# Patient Record
Sex: Female | Born: 1935
Health system: Southern US, Community
[De-identification: ages and names within clinical notes are randomized; demographics above are authoritative.]

## PROBLEM LIST (undated history)

## (undated) DIAGNOSIS — C801 Malignant (primary) neoplasm, unspecified: Secondary | ICD-10-CM

## (undated) DIAGNOSIS — E785 Hyperlipidemia, unspecified: Secondary | ICD-10-CM

## (undated) DIAGNOSIS — J449 Chronic obstructive pulmonary disease, unspecified: Secondary | ICD-10-CM

## (undated) DIAGNOSIS — E039 Hypothyroidism, unspecified: Secondary | ICD-10-CM

## (undated) DIAGNOSIS — J3489 Other specified disorders of nose and nasal sinuses: Secondary | ICD-10-CM

## (undated) DIAGNOSIS — R0600 Dyspnea, unspecified: Secondary | ICD-10-CM

## (undated) DIAGNOSIS — Z923 Personal history of irradiation: Secondary | ICD-10-CM

## (undated) HISTORY — PX: EYE SURGERY: SHX253

## (undated) HISTORY — PX: ABDOMINAL HYSTERECTOMY: SHX81

## (undated) HISTORY — PX: OTHER SURGICAL HISTORY: SHX169

## (undated) HISTORY — DX: Hypothyroidism, unspecified: E03.9

## (undated) HISTORY — DX: Hyperlipidemia, unspecified: E78.5

## (undated) HISTORY — PX: COLONOSCOPY: SHX174

---

## 2003-09-11 ENCOUNTER — Emergency Department (HOSPITAL_COMMUNITY): Admission: EM | Admit: 2003-09-11 | Discharge: 2003-09-11 | Payer: Self-pay | Admitting: Family Medicine

## 2004-01-24 ENCOUNTER — Ambulatory Visit: Payer: Self-pay | Admitting: Internal Medicine

## 2004-02-25 ENCOUNTER — Ambulatory Visit: Payer: Self-pay | Admitting: Internal Medicine

## 2004-02-26 ENCOUNTER — Encounter: Payer: Self-pay | Admitting: Internal Medicine

## 2004-02-26 ENCOUNTER — Ambulatory Visit (HOSPITAL_COMMUNITY): Admission: RE | Admit: 2004-02-26 | Discharge: 2004-02-26 | Payer: Self-pay | Admitting: Internal Medicine

## 2004-03-16 ENCOUNTER — Ambulatory Visit: Payer: Self-pay | Admitting: Internal Medicine

## 2005-03-08 ENCOUNTER — Encounter: Admission: RE | Admit: 2005-03-08 | Discharge: 2005-03-08 | Payer: Self-pay | Admitting: Internal Medicine

## 2006-03-09 ENCOUNTER — Encounter: Admission: RE | Admit: 2006-03-09 | Discharge: 2006-03-09 | Payer: Self-pay | Admitting: Internal Medicine

## 2006-08-22 ENCOUNTER — Emergency Department (HOSPITAL_COMMUNITY): Admission: EM | Admit: 2006-08-22 | Discharge: 2006-08-22 | Payer: Self-pay | Admitting: Emergency Medicine

## 2006-09-06 ENCOUNTER — Encounter: Admission: RE | Admit: 2006-09-06 | Discharge: 2006-09-06 | Payer: Self-pay | Admitting: Orthopaedic Surgery

## 2007-03-14 ENCOUNTER — Encounter: Admission: RE | Admit: 2007-03-14 | Discharge: 2007-03-14 | Payer: Self-pay | Admitting: Internal Medicine

## 2008-03-14 ENCOUNTER — Encounter: Admission: RE | Admit: 2008-03-14 | Discharge: 2008-03-14 | Payer: Self-pay | Admitting: Internal Medicine

## 2009-03-17 ENCOUNTER — Encounter: Admission: RE | Admit: 2009-03-17 | Discharge: 2009-03-17 | Payer: Self-pay | Admitting: Internal Medicine

## 2009-04-03 ENCOUNTER — Encounter: Payer: Self-pay | Admitting: Internal Medicine

## 2009-04-03 ENCOUNTER — Encounter (INDEPENDENT_AMBULATORY_CARE_PROVIDER_SITE_OTHER): Payer: Self-pay | Admitting: *Deleted

## 2009-05-15 DIAGNOSIS — K219 Gastro-esophageal reflux disease without esophagitis: Secondary | ICD-10-CM

## 2009-05-15 DIAGNOSIS — Z8719 Personal history of other diseases of the digestive system: Secondary | ICD-10-CM

## 2009-05-15 DIAGNOSIS — K5900 Constipation, unspecified: Secondary | ICD-10-CM | POA: Insufficient documentation

## 2009-05-15 DIAGNOSIS — K449 Diaphragmatic hernia without obstruction or gangrene: Secondary | ICD-10-CM | POA: Insufficient documentation

## 2009-05-15 DIAGNOSIS — E785 Hyperlipidemia, unspecified: Secondary | ICD-10-CM

## 2009-05-20 ENCOUNTER — Ambulatory Visit: Payer: Self-pay | Admitting: Internal Medicine

## 2009-05-20 DIAGNOSIS — M129 Arthropathy, unspecified: Secondary | ICD-10-CM | POA: Insufficient documentation

## 2009-05-23 ENCOUNTER — Ambulatory Visit: Payer: Self-pay | Admitting: Internal Medicine

## 2009-05-27 ENCOUNTER — Encounter: Payer: Self-pay | Admitting: Internal Medicine

## 2010-02-12 ENCOUNTER — Other Ambulatory Visit: Payer: Self-pay | Admitting: Internal Medicine

## 2010-02-12 DIAGNOSIS — Z1239 Encounter for other screening for malignant neoplasm of breast: Secondary | ICD-10-CM

## 2010-02-19 NOTE — Letter (Signed)
Summary: Patient Notice-Endo Biopsy Results  [facility]  [address] [place], [place] [address]   Phone: [phone]  Fax: [phone]        May 27, 2009 MRN: [id number]    [facility] [address] [place], [place]  [address]    Dear Ms. Orvan Falconer,  I am pleased to inform you that the biopsies taken during your recent endoscopic examination did not show any evidence of cancer upon pathologic examination.Biopsies show mild inflammation due to reflux.  Additional information/recommendations:  __No further action is needed at this time.  Please follow-up with      your primary care physician for your other healthcare needs.  __ Please call [phone] to schedule a return visit to review      your condition.  _x_ Continue with the treatment plan as outlined on the day of your      exam.     Please call us if you are having persistent problems or have questions about your condition that have not been fully answered at this time.  Sincerely,  Hart Carwin MD  This letter has been electronically signed by your physician.  Appended Document: Patient Notice-Endo Biopsy Results letter mailed 5.11.11

## 2010-02-19 NOTE — Procedures (Signed)
Summary: COLON   Colonoscopy  Procedure date:  02/25/2004  Findings:      Location:  [facility].   Patient Name: Jillian Scott, Jillian Scott MRN:  Procedure Procedures: Colonoscopy CPT: 78295.  Incomplete Colonoscopy, unable to go beyond splenic flexure. CPT: M2924229.  Personnel: Endoscopist: Dawsyn Zurn L. Juanda Chance, MD.  Referred By: Creola Corn, MD.  Exam Location: Exam performed in Outpatient Clinic. Outpatient  Patient Consent: Procedure, Alternatives, Risks and Benefits discussed, consent obtained, from patient. Consent was obtained by the RN.  Indications Symptoms: Constipation Patient's stools are infrequent.  History  Current Medications: Patient is not currently taking Coumadin.  Pre-Exam Physical: Performed Feb 25, 2004. Entire physical exam was normal.  Exam Exam: Extent of exam reached: Cecum, extent intended: Cecum.  The cecum was identified by appendiceal orifice and IC valve. Colon retroflexion performed. Images taken. ASA Classification: I. Tolerance: good.  Monitoring: Pulse and BP monitoring, Oximetry used. Supplemental O2 given.  Colon Prep Used Miralax for colon prep. Prep results: good.  Sedation Meds: Patient assessed and found to be appropriate for moderate (conscious) sedation. Fentanyl 25 mcg. given IV. Versed 3 mg. given IV.  Findings - NORMAL EXAM: Transverse Colon. Comments: difficult exam, unable to advance beyond splenic flexure.   Assessment  Comments: suspect  adhesions  Events  Unplanned Interventions: No intervention was required.  Unplanned Events: There were no complications. Plans Comments: Barium enema to vis the rest of the colon Disposition: After procedure patient sent to recovery. After recovery patient sent home.   This report was created from the original endoscopy report, which was reviewed and signed by the above listed endoscopist.

## 2010-02-19 NOTE — Assessment & Plan Note (Signed)
Summary: reflux--ch.   History of Present Illness Visit Type: consult  Primary GI MD: Lina Sar MD Primary Provider: Antony Madura, MD Requesting Provider: Antony Madura, MD Chief Complaint: GERD, belching, constipation, and hemorrhoids History of Present Illness:   This is a 75 year old African American female with complaints of gastroesophageal reflux not controlled on Nexium 40 mg every morning. She denies dysphagia or odynophagia. Her upper endoscopy in February 2006 showed a 2 cm  reducible hiatal hernia . The pathology showed mild inflammation consistent with esophagitis. Patient had a colonoscopy in February 2006 which was incomplete due to adhesions. A subsequent barium enema was also limited due to extreme redundancy of the sigmoid colon. She has diabetes and hyperlipidemia. Patient does not smoke. She has taken Advil until recently when she developed a rash.   GI Review of Systems    Reports acid reflux, belching, and  heartburn.      Denies abdominal pain, bloating, chest pain, dysphagia with liquids, dysphagia with solids, loss of appetite, nausea, vomiting, vomiting blood, weight loss, and  weight gain.      Reports constipation and  hemorrhoids.     Denies anal fissure, black tarry stools, change in bowel habit, diarrhea, diverticulosis, fecal incontinence, heme positive stool, irritable bowel syndrome, jaundice, light color stool, liver problems, rectal bleeding, and  rectal pain.    Current Medications (verified): 1)  Cod Liver Oil  Caps (Cod Liver Oil) .... One Tablet By Mouth Three Times A Day 2)  Vitamin B-12 500 Mcg Tabs (Cyanocobalamin) .... One Tablet By Mouth Once Daily 3)  Vitamin D3 1000 Unit Caps (Cholecalciferol) .... One Capsule By Mouth Once Daily 4)  Garlic Oil 1000 Mg Caps (Garlic) .... One Capsule By Mouth Three Times A Day 5)  Calcium-Magnesium-Zinc 750-375-15 Mg Tabs (Calcium-Magnesium-Zinc) .... One Tablet By Mouth Three Times A Day 6)  Nexium  40 Mg Cpdr (Esomeprazole Magnesium) .... One Tablet By Mouth Once Daily  Allergies (verified): No Known Drug Allergies  Past History:  Past Medical History: Current Problems:  ARTHRITIS (ICD-716.90) CONSTIPATION (ICD-564.00) HYPERLIPIDEMIA (ICD-272.4) GERD (ICD-530.81) REFLUX ESOPHAGITIS, HX OF (ICD-V12.79) HIATAL HERNIA (ICD-553.3)      Past Surgical History: Reviewed history from 05/15/2009 and no changes required. Partial Hysterectomy  Family History: Reviewed history from 05/15/2009 and no changes required. Family History of Heart Disease: Father Family History of Diabetes: Mother No FH of Colon Cancer:  Social History: Retired Single One child Alcohol Use - no Illicit Drug Use - no Patient has never smoked.  Smoking Status:  never  Review of Systems       The patient complains of allergy/sinus, arthritis/joint pain, and headaches-new.  The patient denies anemia, anxiety-new, back pain, blood in urine, breast changes/lumps, change in vision, confusion, cough, coughing up blood, depression-new, fainting, fatigue, fever, hearing problems, heart murmur, heart rhythm changes, itching, menstrual pain, muscle pains/cramps, night sweats, nosebleeds, pregnancy symptoms, shortness of breath, skin rash, sleeping problems, sore throat, swelling of feet/legs, swollen lymph glands, thirst - excessive , urination - excessive , urination changes/pain, urine leakage, vision changes, and voice change.         Pertinent positive and negative review of systems were noted in the above HPI. All other ROS was otherwise negative.   Vital Signs:  Patient profile:   75 year old female Height:      63 inches Weight:      140 pounds BMI:     24.89 BSA:     1.66 Pulse  rate:   60 / minute Pulse rhythm:   regular BP sitting:   128 / 64  (left arm) Cuff size:   regular  Vitals Entered By: Ok Anis CMA (May 20, 2009 9:37 AM)  Physical Exam  General:  Well developed, well nourished,  no acute distress. Mouth:  No deformity or lesions, dentition normal. Neck:  Supple; no masses or thyromegaly. Lungs:  Clear throughout to auscultation. Heart:  Regular rate and rhythm; no murmurs, rubs,  or bruits. Abdomen:  Soft, nontender and nondistended. No masses, hepatosplenomegaly or hernias noted. Normal bowel sounds. Extremities:  No clubbing, cyanosis, edema or deformities noted. Skin:  Intact without significant lesions or rashes. Psych:  Alert and cooperative. Normal mood and affect.   Impression & Recommendations:  Problem # 1:  GERD (ICD-530.81) Patient has gastroesophageal reflux refractory to Nexium 40 mg . There is no suggestion for aspiration or LPR. I have asked her to take Pepcid 40 mg at bedtime in addition to the Nexium 40 mg in the morning. We will schedule her for an upper endoscopy to rule out Barrett's esophagus and to assess the size of her hiatal hernia which measured 2 cm 5 years ago. She was advised to avoid Advil and  follow antireflux measures as well.  Orders: EGD (EGD)  Problem # 2:  CONSTIPATION (ICD-564.00) Patient had a redundant, tortuous colon demonstrated on her barium enema and on an incomplete colonoscopy in February 2006.  Patient Instructions: 1)  Follow antireflux measures. 2)  Nexium 40 mg p.o. q.a.m. 3)  Pepcid 40 mg p.o. q.h.s. 4)  Upper endoscopy with biopsies. 5)  Copy sent to : Dr Zenaida Deed 6)  The medication list was reviewed and reconciled.  All changed / newly prescribed medications were explained.  A complete medication list was provided to the patient / caregiver. Prescriptions: PEPCID 40 MG TABS (FAMOTIDINE) Take 1 tablet by mouth at bedtime  #30 x 3   Entered by:   Lamona Curl CMA (AAMA)   Authorized by:   Hart Carwin MD   Signed by:   Lamona Curl CMA (AAMA) on 05/20/2009   Method used:   Electronically to        [facility]  [phone]* (retail)       [address]       [place], [place]  [address]       Ph: [phone] or [phone]       Fax: [phone]   RxID:   [id number]

## 2010-02-19 NOTE — Letter (Signed)
Summary: New Patient letter  [facility]  [address] [place], [place] [address]   Phone: [phone]  Fax: [phone]       04/03/2009 MRN: [id number]  [facility] [place] [address] [address] [place], [place]  [address]  Dear Ms. Orvan Falconer,  Welcome to the Gastroenterology Division at [facility].    You are scheduled to see Dr.  Juanda Chance on 05-20-09 at 9:15a.m. on the 3rd floor at [facility], [address]  We ask that you try to arrive at our office 15 minutes prior to your appointment time to allow for check-in.  We would like you to complete the enclosed self-administered evaluation form prior to your visit and bring it with you on the day of your appointment.  We will review it with you.  Also, please bring a complete list of all your medications or, if you prefer, bring the medication bottles and we will list them.  Please bring your insurance card so that we may make a copy of it.  If your insurance requires a referral to see a specialist, please bring your referral form from your primary care physician.  Co-payments are due at the time of your visit and may be paid by cash, check or credit card.     Your office visit will consist of a consult with your physician (includes a physical exam), any laboratory testing he/she may order, scheduling of any necessary diagnostic testing (e.g. x-ray, ultrasound, CT-scan), and scheduling of a procedure (e.g. Endoscopy, Colonoscopy) if required.  Please allow enough time on your schedule to allow for any/all of these possibilities.    If you cannot keep your appointment, please call [phone] to cancel or reschedule prior to your appointment date.  This allows [place] the opportunity to schedule an appointment for another patient in need of care.  If you do not cancel or reschedule by 5 p.m. the business day prior to your appointment date, you will be charged a $50.00 late cancellation/no-show fee.    Thank you for choosing  [facility] for your medical needs.  We appreciate the opportunity to care for you.  Please visit [place] at our website  to learn more about our practice.                     Sincerely,                                                             The Gastroenterology Division

## 2010-02-19 NOTE — Procedures (Signed)
Summary: Upper Endoscopy  Patient: Jillian Scott Note: All result statuses are Final unless otherwise noted.  Tests: (1) Upper Endoscopy (EGD)   EGD Upper Endoscopy       DONE     [facility]     [address]     [place], [place]  [address]           ENDOSCOPY PROCEDURE REPORT           PATIENT:  [name], [name]  MR#:  [id number]     BIRTHDATE:  1935/04/20, 73 yrs. old  GENDER:  female           ENDOSCOPIST:  Hedwig Morton. Juanda Chance, MD     Referred by:  Burton Apley, M.D.           PROCEDURE DATE:  05/23/2009     PROCEDURE:  EGD with biopsy     ASA CLASS:  Class II     INDICATIONS:  GERD, heartburn GERD not controlled,     on Nexiem 40 mg po qd     EGD in 2006 showed 2 cm hiatal hernia           MEDICATIONS:   Versed 6 mg, Fentanyl 50 mcg     TOPICAL ANESTHETIC:  Exactacain Spray           DESCRIPTION OF PROCEDURE:   After the risks benefits and     alternatives of the procedure were thoroughly explained, informed     consent was obtained.  The LB GIF-H180 [id number] endoscope was     introduced through the mouth and advanced to the second portion of     the duodenum, without limitations.  The instrument was slowly     withdrawn as the mucosa was fully examined.     <<PROCEDUREIMAGES>>           A hiatal hernia was found. 2 cm hhernia     r/o Barrett's esophagus, increased fibrous tissue at g-e junction     With standard forceps, a biopsy was obtained and sent to pathology     (see image1, image2, and image3).  Otherwise the examination was     normal (see image5 and image4).    Retroflexed views revealed no     abnormalities.    The scope was then withdrawn from the patient     and the procedure completed.           COMPLICATIONS:  None           ENDOSCOPIC IMPRESSION:     1) Hiatal hernia     2) Otherwise normal examination     3) Antireflux measures     nexien 40 mg po qd     Pepcid 40 mg po qhs     consider adding Reglan     RECOMMENDATIONS:     1)  Anti-reflux regimen to be follow     2) Await biopsy results           REPEAT EXAM:  In 0 year(s) for.           ______________________________     Hedwig Morton. Juanda Chance, MD           CC:           n.     eSIGNED:   Hedwig Morton. Boy Delamater at 05/23/2009 03:23 PM           Carma Lair, [id number]  Note: An exclamation mark (!) indicates a  result that was not dispersed into the flowsheet. Document Creation Date: 05/23/2009 3:24 PM _______________________________________________________________________  (1) Order result status: Final Collection or observation date-time: 05/23/2009 15:15 Requested date-time:  Receipt date-time:  Reported date-time:  Referring Physician:   Ordering Physician: Lina Sar [phone]) Specimen Source:  Source: Launa Grill Order Number: [id number] Lab site:

## 2010-02-19 NOTE — Assessment & Plan Note (Signed)
Summary: Gastroenterology   Page #2  NAME:  Jillian Scott, Jillian Scott  OFFICE NO:  [id number]  DATE:  03/16/04  DOB:  05-31-2035  HISTORY:  The patient returns today to discuss results of the colonoscopy.  She had a screening colonoscopy on 02/25/04, which was a rather difficult exam.  I was unable to negotiate through the transverse colon due to redundancy.  She ended up having to have a barium enema at [facility], which also showed redundant sigmoid colon with only limited contrast migrating through the colon to transverse colon to the right colon.  She was unable to evacuate the barium and was quite uncomfortable during the exam.  There were no gross strictures or masses found in the right colon, but the visualization was limited.  I explained it today to the patient that she has a redundant long colon that probably explains her constipation, but no gross lesions were found.  Since she remains asymptomatic, I would not pursue it at this time, but would consider doing virtual colonoscopy in the next 4 to 5 years when she is due for recall.  We have also discussed the use of laxatives.  She is currently on milk of magnesia 30 cc 3 times a week.  That seems to be a quite satisfactory regimen for her and I could not recommend anything better than that.  As far as upper endoscopy is concerned, she had a small hiatal hernia with mild reflux esophagitis for which she takes Nexium 40 mg a day.  PLAN: 1.   Recall colonoscopy in 5 years.  At that point, we will consider virtual colonoscopy. 2.   Nexium 40 mg, take daily or p.r.n. epigastric discomfort. 3.   Milk of magnesia 30 to 45 cc three times a week.    Hedwig Morton. Juanda Chance, M.D.  DMB/rdj/med cc:  Gwen Pounds, M.D. D:  03/16/04; T:  03/17/04; Job [phone]

## 2010-02-19 NOTE — Letter (Signed)
Summary: EGD Instructions  [facility]  [address] [place], [place] [address]   Phone: [phone]  Fax: [phone]       Jillian Scott    Oct 02, 1935    MRN: [id number]       Procedure Day /Date: 05/23/09 Friday     Arrival Time:  2:00 pm     Procedure Time: 3:00 pm     Location of Procedure:                    _ x _ [facility] (4th Floor)  PREPARATION FOR ENDOSCOPY   On 05/23/09 THE DAY OF THE PROCEDURE:  1.   No solid foods, milk or milk products are allowed after midnight the night before your procedure.  2.   Do not drink anything colored red or purple.  Avoid juices with pulp.  No orange juice.  3.  You may drink clear liquids until 1:00 pm, which is 2 hours before your procedure.                                                                                                CLEAR LIQUIDS INCLUDE: Water Jello Ice Popsicles Tea (sugar ok, no milk/cream) Powdered fruit flavored drinks Coffee (sugar ok, no milk/cream) Gatorade Juice: apple, white grape, white cranberry  Lemonade Clear bullion, consomm, broth Carbonated beverages (any kind) Strained chicken noodle soup Hard Candy   MEDICATION INSTRUCTIONS  Unless otherwise instructed, you should take regular prescription medications with a small sip of water as early as possible the morning of your procedure.                  OTHER INSTRUCTIONS  You will need a responsible adult at least 73 years of age to accompany you and drive you home.   This person must remain in the waiting room during your procedure.  Wear loose fitting clothing that is easily removed.  Leave jewelry and other valuables at home.  However, you may wish to bring a book to read or an iPod/MP3 player to listen to music as you wait for your procedure to start.  Remove all body piercing jewelry and leave at home.  Total time from sign-in until discharge is approximately 2-3 hours.  You should go  home directly after your procedure and rest.  You can resume normal activities the day after your procedure.  The day of your procedure you should not:   Drive   Make legal decisions   Operate machinery   Drink alcohol   Return to work  You will receive specific instructions about eating, activities and medications before you leave.    The above instructions have been reviewed and explained to me by  Lamona Curl CMA Duncan Dull)  May 20, 2009 10:20 AM     I fully understand and can verbalize these instructions _____________________________ Date 05/19/09

## 2010-02-19 NOTE — Procedures (Signed)
Summary: EGD   EGD  Procedure date:  02/25/2004  Findings:      Location: [facility]   Patient Name: Jillian Scott, Jillian Scott MRN:  Procedure Procedures: Panendoscopy (EGD) CPT: 43235.    with biopsy(s)/brushing(s). CPT: D1846139.  Personnel: Endoscopist: Dora L. Juanda Chance, MD.  Referred By: Creola Corn, MD.  Exam Location: Exam performed in Outpatient Clinic. Outpatient  Patient Consent: Procedure, Alternatives, Risks and Benefits discussed, consent obtained, from patient. Consent was obtained by the RN.  Indications Symptoms: Abdominal pain, location: epigastric. Reflux symptoms for 6-10 yrs,  History  Current Medications: Patient is not currently taking Coumadin.  Pre-Exam Physical: Performed Feb 25, 2004  Entire physical exam was normal.  Exam Exam Info: Maximum depth of insertion Duodenum, intended Duodenum. Vocal cords visualized. Gastric retroflexion performed. Images taken. ASA Classification: I. Tolerance: good.  Sedation Meds: Patient assessed and found to be appropriate for moderate (conscious) sedation. Fentanyl 50 mcg. given IV. Versed 5 mg. given IV. Cetacaine Spray 2 sprays given aerosolized.  Monitoring: BP and pulse monitoring done. Oximetry used. Supplemental O2 given  Findings - HIATAL HERNIA: Diaphragm 37 cm from mouth. Z-line/GE Junction 35 cm from mouth. 2 cms. in length. reducible, sliding hernia. Biopsy/Hiatal Hernia taken.  ICD9: Hernia, Hiatal: 553.3.Comments: mild Grade i esophagitis at the g-e junction.  DIAGNOSTIC TEST: from Body. RUT done, results pending   Assessment Abnormal examination, see findings above.  Diagnoses: 553.3: Hernia, Hiatal.   Comments: Hiatal hernia with mild reflux esophagitis Events  Unplanned Intervention: No unplanned interventions were required.  Unplanned Events: There were no complications. Plans Medication(s): Await pathology. Other: nexiem 40mg  QD, starting Feb 25, 2004   Disposition: After  procedure patient sent to recovery. After recovery patient sent home.   This report was created from the original endoscopy report, which was reviewed and signed by the above listed endoscopist.    RUT NEGATIVE

## 2010-03-18 ENCOUNTER — Ambulatory Visit
Admission: RE | Admit: 2010-03-18 | Discharge: 2010-03-18 | Disposition: A | Discharge status: Home / Self Care | Payer: Medicare Other | Source: Ambulatory Visit | Attending: Internal Medicine | Admitting: Internal Medicine

## 2010-03-18 DIAGNOSIS — Z1239 Encounter for other screening for malignant neoplasm of breast: Secondary | ICD-10-CM

## 2011-02-12 ENCOUNTER — Other Ambulatory Visit: Payer: Self-pay | Admitting: Internal Medicine

## 2011-02-12 DIAGNOSIS — Z1231 Encounter for screening mammogram for malignant neoplasm of breast: Secondary | ICD-10-CM

## 2011-03-19 ENCOUNTER — Ambulatory Visit
Admission: RE | Admit: 2011-03-19 | Discharge: 2011-03-19 | Disposition: A | Discharge status: Home / Self Care | Payer: Medicare Other | Source: Ambulatory Visit | Attending: Internal Medicine | Admitting: Internal Medicine

## 2011-03-19 DIAGNOSIS — Z1231 Encounter for screening mammogram for malignant neoplasm of breast: Secondary | ICD-10-CM

## 2011-03-30 ENCOUNTER — Emergency Department (HOSPITAL_COMMUNITY)
Admission: EM | Admit: 2011-03-30 | Discharge: 2011-03-31 | Disposition: A | Discharge status: Home / Self Care | Payer: Medicare Other | Attending: Emergency Medicine | Admitting: Emergency Medicine

## 2011-03-30 ENCOUNTER — Encounter (HOSPITAL_COMMUNITY): Payer: Self-pay | Admitting: Emergency Medicine

## 2011-03-30 ENCOUNTER — Emergency Department (HOSPITAL_COMMUNITY): Payer: Medicare Other

## 2011-03-30 DIAGNOSIS — K5900 Constipation, unspecified: Secondary | ICD-10-CM | POA: Insufficient documentation

## 2011-03-30 DIAGNOSIS — K644 Residual hemorrhoidal skin tags: Secondary | ICD-10-CM

## 2011-03-30 LAB — CBC
MCHC: 33.6 g/dL (ref 30.0–36.0)
MCV: 86.2 fL (ref 78.0–100.0)
RBC: 4.84 MIL/uL (ref 3.87–5.11)
RDW: 14.7 % (ref 11.5–15.5)

## 2011-03-30 LAB — OCCULT BLOOD, POC DEVICE: Fecal Occult Bld: POSITIVE

## 2011-03-30 MED ORDER — PRAMOXINE-ZINC OXIDE IN MO 1-12.5 % RE OINT: TOPICAL_OINTMENT | RECTAL | Status: DC

## 2011-03-30 MED ORDER — MAGNESIUM CITRATE PO SOLN
1.0000 | ORAL | Status: AC
  Administered 2011-03-31: 1 via ORAL
  Filled 2011-03-30: qty 296

## 2011-03-30 MED ORDER — FLEET ENEMA 7-19 GM/118ML RE ENEM
1.0000 | ENEMA | Freq: Once | RECTAL | Status: AC
Start: 2011-03-30 — End: 2011-03-31
  Administered 2011-03-31: via RECTAL
  Filled 2011-03-30: qty 1

## 2011-03-30 MED ORDER — POLYETHYLENE GLYCOL 3350 17 GM/SCOOP PO POWD: 17.0000 g | Freq: Every day | ORAL | Status: AC

## 2011-03-30 NOTE — Discharge Instructions (Signed)

## 2011-03-30 NOTE — ED Notes (Signed)
Pt returned from radiology stable and in no acute distress.

## 2011-03-30 NOTE — ED Provider Notes (Addendum)
History     CSN: [id number]  Arrival date & time 03/30/11  2123   First MD Initiated Contact with Patient 03/30/11 2255      Chief Complaint  Patient presents with  . Constipation    (Consider location/radiation/quality/duration/timing/severity/associated sxs/prior treatment) HPI No bowel movement last 9 days. Patient has history of constipation. She tried Metamucil last night and she tried an enema today. She has not taken any other stool softeners. She states she drinks plenty of water. She does not relax. No new medications otherwise. No fevers or chills. Patient has had some uncomfortable hemorrhoids that she date have not been bleeding, but are uncomfortable. No discrete abdominal pain. No nausea or vomiting. Moderate in severity. No no alleviating factors. hAs required disimpaction in the past.   History reviewed. No pertinent past medical history.  Past Surgical History  Procedure Date  . Abdominal hysterectomy     No family history on file.  History  Substance Use Topics  . Smoking status: Never Smoker   . Smokeless tobacco: Not on file  . Alcohol Use: No    OB History    Grav Para Term Preterm Abortions TAB SAB Ect Mult Living                  Review of Systems  Constitutional: Negative for fever and chills.  HENT: Negative for neck pain and neck stiffness.   Eyes: Negative for pain.  Respiratory: Negative for shortness of breath.   Cardiovascular: Negative for chest pain.  Gastrointestinal: Positive for constipation. Negative for nausea, vomiting, abdominal pain, abdominal distention and rectal pain.  Genitourinary: Negative for dysuria.  Musculoskeletal: Negative for back pain.  Skin: Negative for rash.  Neurological: Negative for headaches.  All other systems reviewed and are negative.    Allergies  Review of patient's allergies indicates no known allergies.  Home Medications   Current Outpatient Rx  Name Route Sig Dispense Refill  .  CALCIUM-MAGNESIUM-ZINC PO Oral Take 1 tablet by mouth 2 (two) times daily.    Marland Kitchen VITAMIN B-12 PO Oral Take 1 tablet by mouth daily.    Marland Kitchen ESOMEPRAZOLE MAGNESIUM 40 MG PO CPDR Oral Take 40 mg by mouth daily before breakfast.    . ROSUVASTATIN CALCIUM 40 MG PO TABS Oral Take 40 mg by mouth daily.    Marland Kitchen VITAMIN D (CHOLECALCIFEROL) PO Oral Take 1 tablet by mouth daily.      BP 155/76  Pulse 116  Temp(Src) 98.6 F (37 C) (Oral)  Resp 20  SpO2 100%  Physical Exam  Constitutional: She is oriented to person, place, and time. She appears well-developed and well-nourished.  HENT:  Head: Normocephalic and atraumatic.  Eyes: Conjunctivae and EOM are normal. Pupils are equal, round, and reactive to light.  Neck: Trachea normal. Neck supple. No thyromegaly present.  Cardiovascular: Normal rate, regular rhythm, S1 normal, S2 normal and normal pulses.     No systolic murmur is present   No diastolic murmur is present  Pulses:      Radial pulses are 2+ on the right side, and 2+ on the left side.  Pulmonary/Chest: Effort normal and breath sounds normal. She has no wheezes. She has no rhonchi. She has no rales. She exhibits no tenderness.  Abdominal: Soft. Normal appearance and bowel sounds are normal. There is no tenderness. There is no CVA tenderness and negative Murphy's sign.  Genitourinary:       External hemorrhoids present, no thrombosed hemorrhoids. No blood or evidence  of active bleeding hemorrhoids. Stool in rectal vault is soft, brown without palpable impaction  Musculoskeletal:       BLE:s Calves nontender, no cords or erythema, negative Homans sign  Neurological: She is alert and oriented to person, place, and time. She has normal strength. No cranial nerve deficit or sensory deficit. GCS eye subscore is 4. GCS verbal subscore is 5. GCS motor subscore is 6.  Skin: Skin is warm and dry. No rash noted. She is not diaphoretic.  Psychiatric: Her speech is normal.       Cooperative and  appropriate    ED Course  Procedures (including critical care time)  Results for orders placed during the hospital encounter of 03/30/11  CBC      Component Value Range   WBC 16.4 (*) 4.0 - 10.5 (K/uL)   RBC 4.84  3.87 - 5.11 (MIL/uL)   Hemoglobin 14.0  12.0 - 15.0 (g/dL)   HCT 40.9  81.1 - 91.4 (%)   MCV 86.2  78.0 - 100.0 (fL)   MCH 28.9  26.0 - 34.0 (pg)   MCHC 33.6  30.0 - 36.0 (g/dL)   RDW 78.2  95.6 - 21.3 (%)   Platelets 244  150 - 400 (K/uL)  OCCULT BLOOD, POC DEVICE      Component Value Range   Fecal Occult Bld POSITIVE    POCT I-STAT, CHEM 8      Component Value Range   Sodium 136  135 - 145 (mEq/L)   Potassium 4.5  3.5 - 5.1 (mEq/L)   Chloride 101  96 - 112 (mEq/L)   BUN 10  6 - 23 (mg/dL)   Creatinine, Ser 0.86  0.50 - 1.10 (mg/dL)   Glucose, Bld 578 (*) 70 - 99 (mg/dL)   Calcium, Ion 4.69  6.29 - 1.32 (mmol/L)   TCO2 26  0 - 100 (mmol/L)   Hemoglobin 15.0  12.0 - 15.0 (g/dL)   HCT 52.8  41.3 - 24.4 (%)   Dg Abd 1 View  03/30/2011  *RADIOLOGY REPORT*  Clinical Data: Chronic constipation  ABDOMEN - 1 VIEW  Comparison: None.  Findings: Significant stool throughout the colon.  Gas-filled loops of small bowel are present without disproportionate dilatation.  No obvious free intraperitoneal gas.  Phlebolith projects over the pelvis.  IMPRESSION: Nonobstructive bowel gas pattern.  Prominent stool.  Original Report Authenticated By: Donavan Burnet, M.D.   Mm Digital Screening  03/22/2011  DG SCREEN MAMMOGRAM BILATERAL Bilateral CC and MLO view(s) were taken.  DIGITAL SCREENING MAMMOGRAM WITH CAD: The breast tissue is heterogeneously dense.  No masses or malignant type calcifications are  identified.  Compared with prior studies.  Images were processed with CAD.  IMPRESSION: No specific mammographic evidence of malignancy.  Next screening mammogram is recommended in one  year.  A result letter of this screening mammogram will be mailed directly to the patient.  ASSESSMENT:  Negative - BI-RADS 1  Screening mammogram in 1 year. ,    Dg Abd 1 View  03/30/2011  *RADIOLOGY REPORT*  Clinical Data: Chronic constipation  ABDOMEN - 1 VIEW  Comparison: None.  Findings: Significant stool throughout the colon.  Gas-filled loops of small bowel are present without disproportionate dilatation.  No obvious free intraperitoneal gas.  Phlebolith projects over the pelvis.  IMPRESSION: Nonobstructive bowel gas pattern.  Prominent stool.  Original Report Authenticated By: Donavan Burnet, M.D.   Patient initially tachycardic on initial evaluation very anxious. Recheck vital signs improved  MDM  Constipation with benign abdominal exam. Patient declines manual disimpaction requests medication trial. MiraLAX prescribed. Patient agrees to start stool softeners over-the-counter as well.  Constipation precautions verbalized is understood. Plan outpatient followup as needed. Anusol prescribed as well for hemorrhoids        Sunnie Nielsen, MD 03/30/11 3244  Sunnie Nielsen, MD 03/31/11 [id number]

## 2011-03-30 NOTE — ED Notes (Signed)
PT. REPORTS CONSTIPATION ONSET THIS EVENING , ALSO CLAIM HEMORRHOIDS WITH NO BLEEDING.

## 2011-03-31 LAB — POCT I-STAT, CHEM 8
Calcium, Ion: 1.13 mmol/L (ref 1.12–1.32)
HCT: 44 % (ref 36.0–46.0)
Hemoglobin: 15 g/dL (ref 12.0–15.0)
TCO2: 26 mmol/L (ref 0–100)

## 2012-02-22 ENCOUNTER — Other Ambulatory Visit: Payer: Self-pay | Admitting: Internal Medicine

## 2012-02-22 DIAGNOSIS — Z1231 Encounter for screening mammogram for malignant neoplasm of breast: Secondary | ICD-10-CM

## 2012-03-21 ENCOUNTER — Ambulatory Visit: Payer: Medicare Other

## 2012-03-28 ENCOUNTER — Ambulatory Visit
Admission: RE | Admit: 2012-03-28 | Discharge: 2012-03-28 | Disposition: A | Discharge status: Home / Self Care | Payer: Medicare Other | Source: Ambulatory Visit | Attending: Internal Medicine | Admitting: Internal Medicine

## 2012-03-28 DIAGNOSIS — Z1231 Encounter for screening mammogram for malignant neoplasm of breast: Secondary | ICD-10-CM

## 2012-05-24 ENCOUNTER — Emergency Department (HOSPITAL_COMMUNITY): Payer: Medicare Other

## 2012-05-24 ENCOUNTER — Emergency Department (HOSPITAL_COMMUNITY)
Admission: EM | Admit: 2012-05-24 | Discharge: 2012-05-24 | Disposition: A | Discharge status: Home / Self Care | Payer: Medicare Other | Attending: Emergency Medicine | Admitting: Emergency Medicine

## 2012-05-24 ENCOUNTER — Encounter (HOSPITAL_COMMUNITY): Payer: Self-pay | Admitting: *Deleted

## 2012-05-24 DIAGNOSIS — Z79899 Other long term (current) drug therapy: Secondary | ICD-10-CM | POA: Insufficient documentation

## 2012-05-24 DIAGNOSIS — H938X1 Other specified disorders of right ear: Secondary | ICD-10-CM

## 2012-05-24 DIAGNOSIS — J3489 Other specified disorders of nose and nasal sinuses: Secondary | ICD-10-CM | POA: Insufficient documentation

## 2012-05-24 DIAGNOSIS — H938X9 Other specified disorders of ear, unspecified ear: Secondary | ICD-10-CM | POA: Insufficient documentation

## 2012-05-24 DIAGNOSIS — H538 Other visual disturbances: Secondary | ICD-10-CM | POA: Insufficient documentation

## 2012-05-24 DIAGNOSIS — H547 Unspecified visual loss: Secondary | ICD-10-CM

## 2012-05-24 LAB — SEDIMENTATION RATE: Sed Rate: 10 mm/hr (ref 0–22)

## 2012-05-24 MED ORDER — TETRACAINE HCL 0.5 % OP SOLN
1.0000 [drp] | Freq: Once | OPHTHALMIC | Status: AC
  Administered 2012-05-24: 1 [drp] via OPHTHALMIC
  Filled 2012-05-24: qty 2

## 2012-05-24 NOTE — ED Notes (Signed)
Her ear feels like it is full of water

## 2012-05-24 NOTE — ED Provider Notes (Signed)
77 year old female with right. Her regular pain, sinus tenderness on the right and decreased vision in the right eye. There is no signs of temporal artery tenderness on my exam, no TMJ tenderness and no difficulty with opening or closing her mouth. Lungs and heart are clear, tympanic membranes visualized without any obstructing cerumen, erythema, bulging or opacification. Visual acuity a symmetric. Tono-Pen used for IOP, no significant elevation. Patient will need ophthalmology referral. CT scan show no signs of neurologic abnormalities, neurology consultation by physician assistant and agrees not temporal arteritis.   Medical screening examination/treatment/procedure(s) were conducted as a shared visit with non-physician practitioner(s) and myself.  I personally evaluated the patient during the encounter    Vida Roller, MD 05/24/12 2344

## 2012-05-24 NOTE — ED Provider Notes (Signed)
History    This chart was scribed for non-physician practitioner working with Vida Roller, MD by Smitty Pluck, ED scribe. This patient was seen in room TR04C/TR04C and the patient's care was started at 6:00 PM.   CSN: [id number]  Arrival date & time 05/24/12  1721       Chief Complaint  Patient presents with  . Otalgia     The history is provided by the patient and medical records. No language interpreter was used.   Rever Pichette is a 77 y.o. female with hx of DM who presents to the Emergency Department with chief complaint of constant, moderate right otalgia that has been ongoing for past 2 weeks. Pt reports that her right ear feels "plugged." She states that she feels pressure around her right eye and blurred vision in the right eye.She reports that she had sinus congestion and pressure 1 month ago.  Pt denies injury to right ear fever, chills, nausea, vomiting, diarrhea, weakness, cough, SOB and any other pain.    PCP is Dr. Su Hilt   History reviewed. No pertinent past medical history.  Past Surgical History  Procedure Laterality Date  . Abdominal hysterectomy      No family history on file.  History  Substance Use Topics  . Smoking status: Never Smoker   . Smokeless tobacco: Not on file  . Alcohol Use: No    OB History   Grav Para Term Preterm Abortions TAB SAB Ect Mult Living                  Review of Systems 10 Systems reviewed and all are negative for acute change except as noted in the HPI.   Allergies  Review of patient's allergies indicates no known allergies.  Home Medications   Current Outpatient Rx  Name  Route  Sig  Dispense  Refill  . CALCIUM-MAGNESIUM-ZINC PO   Oral   Take 1 tablet by mouth 2 (two) times daily.         . Cyanocobalamin (VITAMIN B-12 PO)   Oral   Take 1 tablet by mouth daily.         Marland Kitchen esomeprazole (NEXIUM) 40 MG capsule   Oral   Take 40 mg by mouth daily before breakfast.         . rosuvastatin (CRESTOR)  40 MG tablet   Oral   Take 40 mg by mouth daily.         Marland Kitchen VITAMIN D, CHOLECALCIFEROL, PO   Oral   Take 1 tablet by mouth daily.           BP 149/75  Pulse 77  Temp(Src) 98.8 F (37.1 C) (Oral)  Resp 16  SpO2 100%  Physical Exam  Nursing note and vitals reviewed. Constitutional: She is oriented to person, place, and time. She appears well-developed and well-nourished. No distress.  HENT:  Head: Normocephalic and atraumatic.  Equal hearing  AC>BC bilaterally  Tympanic membranes are clear and pearly gray bilaterally with anterior cone of light  Frontal and maxillary sinuses are nontender to palpation No tenderness over the temporal bone   Eyes: Conjunctivae and EOM are normal. Pupils are equal, round, and reactive to light.  visualized funduscopic exam is nl  Eye pressure on left is 12 and eye pressure on right is 19 Visual Acuity  -  Bilateral Near:  20/25 ; R Near:  20/50 ; L Near:  20/30  Neck: Neck supple. No tracheal deviation present.  Cardiovascular: Normal  rate, regular rhythm and normal heart sounds.  Exam reveals no gallop and no friction rub.   No murmur heard. Pulmonary/Chest: Effort normal and breath sounds normal. No respiratory distress. She has no wheezes. She has no rales. She exhibits no tenderness.  Abdominal: She exhibits no distension.  Musculoskeletal: Normal range of motion.  Neurological: She is alert and oriented to person, place, and time.  Skin: Skin is warm and dry.  Psychiatric: She has a normal mood and affect. Her behavior is normal.    ED Course  Procedures (including critical care time) DIAGNOSTIC STUDIES: Oxygen Saturation is 100% on room air, normal by my interpretation.    COORDINATION OF CARE: 6:03 PM Discussed ED treatment with pt and pt agrees.  6:58 PM Dr. Hyacinth Meeker evaluated pt and recommends CT head and CT maxillary.     Results for orders placed during the hospital encounter of 03/30/11  CBC      Result Value Range    WBC 16.4 (*) 4.0 - 10.5 K/uL   RBC 4.84  3.87 - 5.11 MIL/uL   Hemoglobin 14.0  12.0 - 15.0 g/dL   HCT 16.1  09.6 - 04.5 %   MCV 86.2  78.0 - 100.0 fL   MCH 28.9  26.0 - 34.0 pg   MCHC 33.6  30.0 - 36.0 g/dL   RDW 40.9  81.1 - 91.4 %   Platelets 244  150 - 400 K/uL  OCCULT BLOOD, POC DEVICE      Result Value Range   Fecal Occult Bld POSITIVE    POCT I-STAT, CHEM 8      Result Value Range   Sodium 136  135 - 145 mEq/L   Potassium 4.5  3.5 - 5.1 mEq/L   Chloride 101  96 - 112 mEq/L   BUN 10  6 - 23 mg/dL   Creatinine, Ser 7.82  0.50 - 1.10 mg/dL   Glucose, Bld 956 (*) 70 - 99 mg/dL   Calcium, Ion 2.13  0.86 - 1.32 mmol/L   TCO2 26  0 - 100 mmol/L   Hemoglobin 15.0  12.0 - 15.0 g/dL   HCT 57.8  46.9 - 62.9 %   Ct Head Wo Contrast  05/24/2012  *RADIOLOGY REPORT*  Clinical Data:  Headache with otalgia.  2 weeks duration. Right eye pain and pressure with blurred vision.  CT HEAD WITHOUT CONTRAST CT MAXILLOFACIAL WITHOUT CONTRAST  Technique:  Multidetector CT imaging of the head and maxillofacial structures were performed using the standard protocol without intravenous contrast. Multiplanar CT image reconstructions of the maxillofacial structures were also generated.  Comparison:   None.  CT HEAD  Findings: There is no evidence for acute infarction, intracranial hemorrhage, mass lesion, hydrocephalus, or extra-axial fluid. Normal for age cerebral volume.  No significant white matter disease.  Calvarium intact.  Clear paranasal sinuses and orbits. No mastoid fluid.  IMPRESSION: Negative exam.  CT MAXILLOFACIAL  Findings:   There is no visible facial fracture or sinus opacity. The temporal bones are symmetric and normal without fluid or destructive process.   Negative appearing orbits.  Unremarkable nasopharynx.  Bilateral carotid calcification. No visible pathologic lymphadenopathy on this noncontrast exam.  No visible parotid or submandibular gland asymmetry.  Cervical spondylosis, incompletely  evaluated.  IMPRESSION: Unremarkable CT maxillofacial. No acute abnormal process can be seen in the paranasal sinuses, orbits, temporal bones, or nasopharyngeal/parapharyngeal soft tissues.   Original Report Authenticated By: Davonna Belling, M.D.    Ct Maxillofacial Wo Cm  05/24/2012  *  RADIOLOGY REPORT*  Clinical Data:  Headache with otalgia.  2 weeks duration. Right eye pain and pressure with blurred vision.  CT HEAD WITHOUT CONTRAST CT MAXILLOFACIAL WITHOUT CONTRAST  Technique:  Multidetector CT imaging of the head and maxillofacial structures were performed using the standard protocol without intravenous contrast. Multiplanar CT image reconstructions of the maxillofacial structures were also generated.  Comparison:   None.  CT HEAD  Findings: There is no evidence for acute infarction, intracranial hemorrhage, mass lesion, hydrocephalus, or extra-axial fluid. Normal for age cerebral volume.  No significant white matter disease.  Calvarium intact.  Clear paranasal sinuses and orbits. No mastoid fluid.  IMPRESSION: Negative exam.  CT MAXILLOFACIAL  Findings:   There is no visible facial fracture or sinus opacity. The temporal bones are symmetric and normal without fluid or destructive process.   Negative appearing orbits.  Unremarkable nasopharynx.  Bilateral carotid calcification. No visible pathologic lymphadenopathy on this noncontrast exam.  No visible parotid or submandibular gland asymmetry.  Cervical spondylosis, incompletely evaluated.  IMPRESSION: Unremarkable CT maxillofacial. No acute abnormal process can be seen in the paranasal sinuses, orbits, temporal bones, or nasopharyngeal/parapharyngeal soft tissues.   Original Report Authenticated By: Davonna Belling, M.D.       1. Ear fullness, right   2. Visual acuity reduced       MDM  Patient with ear-fullness and decreased acuity.  Patient seen and discussed with Dr. Hyacinth Meeker.  8:32 PM Discussed the patient with Dr. Leroy Kennedy of neurology.  He tells me  that this does not sound like a vascular issue.  He tells me that this does not sound like a temporal arteritis, and agrees that I can follow the sed. rate and discharge the patient with optho follow-up.  Discussed with Dr. Hyacinth Meeker, who also agrees with the plan.    I personally performed the services described in this documentation, which was scribed in my presence. The recorded information has been reviewed and is accurate.     Roxy Horseman, PA-C 05/24/12 2052

## 2012-05-24 NOTE — ED Notes (Signed)
The pt is c/o rt ear pain for one week and the pain is causing her rt eye to be blurred at times

## 2013-02-22 ENCOUNTER — Other Ambulatory Visit: Payer: Self-pay

## 2013-02-22 DIAGNOSIS — Z1231 Encounter for screening mammogram for malignant neoplasm of breast: Secondary | ICD-10-CM

## 2013-03-29 ENCOUNTER — Ambulatory Visit
Admission: RE | Admit: 2013-03-29 | Discharge: 2013-03-29 | Disposition: A | Discharge status: Home / Self Care | Payer: Medicare Other | Source: Ambulatory Visit

## 2013-03-29 DIAGNOSIS — Z1231 Encounter for screening mammogram for malignant neoplasm of breast: Secondary | ICD-10-CM

## 2014-02-22 ENCOUNTER — Encounter: Payer: Self-pay | Admitting: Internal Medicine

## 2014-02-25 DIAGNOSIS — E1165 Type 2 diabetes mellitus with hyperglycemia: Secondary | ICD-10-CM | POA: Diagnosis not present

## 2014-02-26 DIAGNOSIS — E1165 Type 2 diabetes mellitus with hyperglycemia: Secondary | ICD-10-CM | POA: Diagnosis not present

## 2014-02-27 ENCOUNTER — Other Ambulatory Visit: Payer: Self-pay

## 2014-02-27 DIAGNOSIS — Z1231 Encounter for screening mammogram for malignant neoplasm of breast: Secondary | ICD-10-CM

## 2014-04-01 ENCOUNTER — Ambulatory Visit
Admission: RE | Admit: 2014-04-01 | Discharge: 2014-04-01 | Disposition: A | Discharge status: Home / Self Care | Payer: Medicare Other | Source: Ambulatory Visit

## 2014-04-01 DIAGNOSIS — Z1231 Encounter for screening mammogram for malignant neoplasm of breast: Secondary | ICD-10-CM | POA: Diagnosis not present

## 2014-07-26 ENCOUNTER — Encounter: Payer: Self-pay | Admitting: Internal Medicine

## 2015-02-26 ENCOUNTER — Other Ambulatory Visit: Payer: Self-pay

## 2015-02-26 DIAGNOSIS — Z1231 Encounter for screening mammogram for malignant neoplasm of breast: Secondary | ICD-10-CM

## 2015-04-02 ENCOUNTER — Ambulatory Visit
Admission: RE | Admit: 2015-04-02 | Discharge: 2015-04-02 | Disposition: A | Discharge status: Home / Self Care | Payer: Medicare Other | Source: Ambulatory Visit

## 2015-04-02 DIAGNOSIS — Z1231 Encounter for screening mammogram for malignant neoplasm of breast: Secondary | ICD-10-CM

## 2015-04-22 ENCOUNTER — Ambulatory Visit (INDEPENDENT_AMBULATORY_CARE_PROVIDER_SITE_OTHER): Payer: Medicare Other

## 2015-04-22 ENCOUNTER — Other Ambulatory Visit (HOSPITAL_COMMUNITY): Payer: Self-pay | Admitting: Internal Medicine

## 2015-04-22 DIAGNOSIS — R079 Chest pain, unspecified: Secondary | ICD-10-CM | POA: Diagnosis not present

## 2015-04-22 DIAGNOSIS — R0602 Shortness of breath: Secondary | ICD-10-CM

## 2015-04-22 DIAGNOSIS — R011 Cardiac murmur, unspecified: Secondary | ICD-10-CM

## 2015-04-22 LAB — EXERCISE TOLERANCE TEST
CHL CUP MPHR: 143 {beats}/min
CHL CUP RESTING HR STRESS: 92 {beats}/min
CHL CUP STRESS STAGE 1 DBP: 84 mmHg
CHL CUP STRESS STAGE 1 GRADE: 0 %
CHL CUP STRESS STAGE 1 HR: 98 {beats}/min
CHL CUP STRESS STAGE 1 SPEED: 0 mph
CHL CUP STRESS STAGE 2 HR: 111 {beats}/min
CHL CUP STRESS STAGE 2 SPEED: 1 mph
CHL CUP STRESS STAGE 3 SPEED: 1 mph
CHL CUP STRESS STAGE 4 DBP: 91 mmHg
CHL CUP STRESS STAGE 4 GRADE: 10 %
CHL CUP STRESS STAGE 4 SPEED: 1.7 mph
CHL CUP STRESS STAGE 5 GRADE: 12 %
CHL CUP STRESS STAGE 5 HR: 118 {beats}/min
CHL CUP STRESS STAGE 5 SPEED: 2.5 mph
CHL CUP STRESS STAGE 6 DBP: 93 mmHg
CHL CUP STRESS STAGE 6 GRADE: 0 %
CHL CUP STRESS STAGE 6 HR: 113 {beats}/min
CHL CUP STRESS STAGE 6 SBP: 187 mmHg
CHL CUP STRESS STAGE 7 DBP: 82 mmHg
CHL RATE OF PERCEIVED EXERTION: 16
CSEPHR: 98 %
CSEPPMHR: 82 %
Estimated workload: 7 METS
Exercise duration (min): 5 min
Exercise duration (sec): 0 s
Peak HR: 118 {beats}/min
Stage 1 SBP: 131 mmHg
Stage 2 Grade: 0 %
Stage 3 Grade: 0.1 %
Stage 3 HR: 111 {beats}/min
Stage 4 HR: 122 {beats}/min
Stage 4 SBP: 188 mmHg
Stage 6 Speed: 0 mph
Stage 7 Grade: 0 %
Stage 7 HR: 96 {beats}/min
Stage 7 SBP: 150 mmHg
Stage 7 Speed: 0 mph

## 2015-05-02 ENCOUNTER — Ambulatory Visit (HOSPITAL_COMMUNITY): Payer: Medicare Other | Attending: Cardiovascular Disease

## 2015-05-02 ENCOUNTER — Other Ambulatory Visit: Payer: Self-pay

## 2015-05-02 DIAGNOSIS — E785 Hyperlipidemia, unspecified: Secondary | ICD-10-CM | POA: Diagnosis not present

## 2015-05-02 DIAGNOSIS — I351 Nonrheumatic aortic (valve) insufficiency: Secondary | ICD-10-CM | POA: Diagnosis not present

## 2015-05-02 DIAGNOSIS — R011 Cardiac murmur, unspecified: Secondary | ICD-10-CM

## 2015-05-02 DIAGNOSIS — I071 Rheumatic tricuspid insufficiency: Secondary | ICD-10-CM | POA: Insufficient documentation

## 2015-07-28 DIAGNOSIS — Z23 Encounter for immunization: Secondary | ICD-10-CM | POA: Diagnosis not present

## 2015-07-28 DIAGNOSIS — E538 Deficiency of other specified B group vitamins: Secondary | ICD-10-CM | POA: Diagnosis not present

## 2015-07-28 DIAGNOSIS — Z0001 Encounter for general adult medical examination with abnormal findings: Secondary | ICD-10-CM | POA: Diagnosis not present

## 2015-07-28 DIAGNOSIS — E559 Vitamin D deficiency, unspecified: Secondary | ICD-10-CM | POA: Diagnosis not present

## 2015-07-28 DIAGNOSIS — M8580 Other specified disorders of bone density and structure, unspecified site: Secondary | ICD-10-CM | POA: Diagnosis not present

## 2015-07-28 DIAGNOSIS — N182 Chronic kidney disease, stage 2 (mild): Secondary | ICD-10-CM | POA: Diagnosis not present

## 2015-07-28 DIAGNOSIS — M859 Disorder of bone density and structure, unspecified: Secondary | ICD-10-CM | POA: Diagnosis not present

## 2015-07-28 DIAGNOSIS — E785 Hyperlipidemia, unspecified: Secondary | ICD-10-CM | POA: Diagnosis not present

## 2015-09-01 DIAGNOSIS — Z1211 Encounter for screening for malignant neoplasm of colon: Secondary | ICD-10-CM | POA: Diagnosis not present

## 2015-09-01 DIAGNOSIS — Z1212 Encounter for screening for malignant neoplasm of rectum: Secondary | ICD-10-CM | POA: Diagnosis not present

## 2015-10-27 DIAGNOSIS — H2513 Age-related nuclear cataract, bilateral: Secondary | ICD-10-CM | POA: Diagnosis not present

## 2015-10-27 DIAGNOSIS — H35363 Drusen (degenerative) of macula, bilateral: Secondary | ICD-10-CM | POA: Diagnosis not present

## 2015-10-27 DIAGNOSIS — H43811 Vitreous degeneration, right eye: Secondary | ICD-10-CM | POA: Diagnosis not present

## 2015-10-27 DIAGNOSIS — H25013 Cortical age-related cataract, bilateral: Secondary | ICD-10-CM | POA: Diagnosis not present

## 2015-10-27 DIAGNOSIS — H35033 Hypertensive retinopathy, bilateral: Secondary | ICD-10-CM | POA: Diagnosis not present

## 2016-01-21 DIAGNOSIS — E559 Vitamin D deficiency, unspecified: Secondary | ICD-10-CM | POA: Diagnosis not present

## 2016-01-21 DIAGNOSIS — E785 Hyperlipidemia, unspecified: Secondary | ICD-10-CM | POA: Diagnosis not present

## 2016-01-21 DIAGNOSIS — E538 Deficiency of other specified B group vitamins: Secondary | ICD-10-CM | POA: Diagnosis not present

## 2016-01-28 DIAGNOSIS — E559 Vitamin D deficiency, unspecified: Secondary | ICD-10-CM | POA: Diagnosis not present

## 2016-01-28 DIAGNOSIS — E7800 Pure hypercholesterolemia, unspecified: Secondary | ICD-10-CM | POA: Diagnosis not present

## 2016-01-28 DIAGNOSIS — E538 Deficiency of other specified B group vitamins: Secondary | ICD-10-CM | POA: Diagnosis not present

## 2016-03-02 ENCOUNTER — Other Ambulatory Visit: Payer: Self-pay | Admitting: Internal Medicine

## 2016-03-02 DIAGNOSIS — Z1231 Encounter for screening mammogram for malignant neoplasm of breast: Secondary | ICD-10-CM

## 2016-04-02 ENCOUNTER — Ambulatory Visit
Admission: RE | Admit: 2016-04-02 | Discharge: 2016-04-02 | Disposition: A | Discharge status: Home / Self Care | Payer: Medicare Other | Source: Ambulatory Visit | Attending: Internal Medicine | Admitting: Internal Medicine

## 2016-04-02 DIAGNOSIS — Z1231 Encounter for screening mammogram for malignant neoplasm of breast: Secondary | ICD-10-CM

## 2016-07-27 DIAGNOSIS — N390 Urinary tract infection, site not specified: Secondary | ICD-10-CM | POA: Diagnosis not present

## 2016-07-27 DIAGNOSIS — E039 Hypothyroidism, unspecified: Secondary | ICD-10-CM | POA: Diagnosis not present

## 2016-07-27 DIAGNOSIS — E559 Vitamin D deficiency, unspecified: Secondary | ICD-10-CM | POA: Diagnosis not present

## 2016-07-27 DIAGNOSIS — Z0000 Encounter for general adult medical examination without abnormal findings: Secondary | ICD-10-CM | POA: Diagnosis not present

## 2016-07-29 DIAGNOSIS — Z0000 Encounter for general adult medical examination without abnormal findings: Secondary | ICD-10-CM | POA: Diagnosis not present

## 2016-08-03 DIAGNOSIS — E039 Hypothyroidism, unspecified: Secondary | ICD-10-CM | POA: Diagnosis not present

## 2016-08-03 DIAGNOSIS — Z0000 Encounter for general adult medical examination without abnormal findings: Secondary | ICD-10-CM | POA: Diagnosis not present

## 2016-08-03 DIAGNOSIS — E7800 Pure hypercholesterolemia, unspecified: Secondary | ICD-10-CM | POA: Diagnosis not present

## 2016-08-03 DIAGNOSIS — M8580 Other specified disorders of bone density and structure, unspecified site: Secondary | ICD-10-CM | POA: Diagnosis not present

## 2016-08-03 DIAGNOSIS — N390 Urinary tract infection, site not specified: Secondary | ICD-10-CM | POA: Diagnosis not present

## 2016-11-02 DIAGNOSIS — H2513 Age-related nuclear cataract, bilateral: Secondary | ICD-10-CM | POA: Diagnosis not present

## 2016-11-02 DIAGNOSIS — H35363 Drusen (degenerative) of macula, bilateral: Secondary | ICD-10-CM | POA: Diagnosis not present

## 2016-11-02 DIAGNOSIS — H25013 Cortical age-related cataract, bilateral: Secondary | ICD-10-CM | POA: Diagnosis not present

## 2016-11-02 DIAGNOSIS — H35033 Hypertensive retinopathy, bilateral: Secondary | ICD-10-CM | POA: Diagnosis not present

## 2017-02-01 DIAGNOSIS — H2512 Age-related nuclear cataract, left eye: Secondary | ICD-10-CM | POA: Diagnosis not present

## 2017-02-01 DIAGNOSIS — H25013 Cortical age-related cataract, bilateral: Secondary | ICD-10-CM | POA: Diagnosis not present

## 2017-02-01 DIAGNOSIS — H35033 Hypertensive retinopathy, bilateral: Secondary | ICD-10-CM | POA: Diagnosis not present

## 2017-02-01 DIAGNOSIS — H35363 Drusen (degenerative) of macula, bilateral: Secondary | ICD-10-CM | POA: Diagnosis not present

## 2017-02-01 DIAGNOSIS — H2513 Age-related nuclear cataract, bilateral: Secondary | ICD-10-CM | POA: Diagnosis not present

## 2017-02-03 DIAGNOSIS — E039 Hypothyroidism, unspecified: Secondary | ICD-10-CM | POA: Diagnosis not present

## 2017-02-03 DIAGNOSIS — E7800 Pure hypercholesterolemia, unspecified: Secondary | ICD-10-CM | POA: Diagnosis not present

## 2017-02-25 ENCOUNTER — Other Ambulatory Visit: Payer: Self-pay | Admitting: Internal Medicine

## 2017-02-25 DIAGNOSIS — Z1231 Encounter for screening mammogram for malignant neoplasm of breast: Secondary | ICD-10-CM

## 2017-04-04 ENCOUNTER — Ambulatory Visit: Payer: Self-pay

## 2017-04-04 ENCOUNTER — Ambulatory Visit
Admission: RE | Admit: 2017-04-04 | Discharge: 2017-04-04 | Disposition: A | Discharge status: Home / Self Care | Payer: Medicare Other | Source: Ambulatory Visit | Attending: Internal Medicine | Admitting: Internal Medicine

## 2017-04-04 DIAGNOSIS — Z1231 Encounter for screening mammogram for malignant neoplasm of breast: Secondary | ICD-10-CM

## 2017-08-01 DIAGNOSIS — E559 Vitamin D deficiency, unspecified: Secondary | ICD-10-CM | POA: Diagnosis not present

## 2017-08-01 DIAGNOSIS — E785 Hyperlipidemia, unspecified: Secondary | ICD-10-CM | POA: Diagnosis not present

## 2017-08-01 DIAGNOSIS — E039 Hypothyroidism, unspecified: Secondary | ICD-10-CM | POA: Diagnosis not present

## 2017-08-01 DIAGNOSIS — Z0000 Encounter for general adult medical examination without abnormal findings: Secondary | ICD-10-CM | POA: Diagnosis not present

## 2017-08-08 DIAGNOSIS — E039 Hypothyroidism, unspecified: Secondary | ICD-10-CM | POA: Diagnosis not present

## 2017-08-08 DIAGNOSIS — Z01419 Encounter for gynecological examination (general) (routine) without abnormal findings: Secondary | ICD-10-CM | POA: Diagnosis not present

## 2017-08-08 DIAGNOSIS — M8580 Other specified disorders of bone density and structure, unspecified site: Secondary | ICD-10-CM | POA: Diagnosis not present

## 2017-08-08 DIAGNOSIS — Z0000 Encounter for general adult medical examination without abnormal findings: Secondary | ICD-10-CM | POA: Diagnosis not present

## 2017-08-08 DIAGNOSIS — E7800 Pure hypercholesterolemia, unspecified: Secondary | ICD-10-CM | POA: Diagnosis not present

## 2017-08-08 DIAGNOSIS — Z1212 Encounter for screening for malignant neoplasm of rectum: Secondary | ICD-10-CM | POA: Diagnosis not present

## 2018-01-09 ENCOUNTER — Emergency Department (HOSPITAL_COMMUNITY): Payer: Medicare Other

## 2018-01-09 ENCOUNTER — Other Ambulatory Visit: Payer: Self-pay

## 2018-01-09 ENCOUNTER — Encounter (HOSPITAL_COMMUNITY): Payer: Self-pay | Admitting: Emergency Medicine

## 2018-01-09 ENCOUNTER — Emergency Department (HOSPITAL_COMMUNITY)
Admission: EM | Admit: 2018-01-09 | Discharge: 2018-01-09 | Disposition: A | Discharge status: Home / Self Care | Payer: Medicare Other | Attending: Emergency Medicine | Admitting: Emergency Medicine

## 2018-01-09 DIAGNOSIS — I700 Atherosclerosis of aorta: Secondary | ICD-10-CM | POA: Diagnosis not present

## 2018-01-09 DIAGNOSIS — Y939 Activity, unspecified: Secondary | ICD-10-CM | POA: Insufficient documentation

## 2018-01-09 DIAGNOSIS — R0789 Other chest pain: Secondary | ICD-10-CM | POA: Diagnosis not present

## 2018-01-09 DIAGNOSIS — M791 Myalgia, unspecified site: Secondary | ICD-10-CM | POA: Diagnosis not present

## 2018-01-09 DIAGNOSIS — R0781 Pleurodynia: Secondary | ICD-10-CM | POA: Diagnosis not present

## 2018-01-09 DIAGNOSIS — Y999 Unspecified external cause status: Secondary | ICD-10-CM | POA: Diagnosis not present

## 2018-01-09 DIAGNOSIS — S299XXA Unspecified injury of thorax, initial encounter: Secondary | ICD-10-CM | POA: Diagnosis not present

## 2018-01-09 DIAGNOSIS — W11XXXA Fall on and from ladder, initial encounter: Secondary | ICD-10-CM | POA: Diagnosis not present

## 2018-01-09 DIAGNOSIS — M546 Pain in thoracic spine: Secondary | ICD-10-CM | POA: Diagnosis not present

## 2018-01-09 DIAGNOSIS — Y929 Unspecified place or not applicable: Secondary | ICD-10-CM | POA: Insufficient documentation

## 2018-01-09 DIAGNOSIS — Z7982 Long term (current) use of aspirin: Secondary | ICD-10-CM | POA: Insufficient documentation

## 2018-01-09 DIAGNOSIS — Z79899 Other long term (current) drug therapy: Secondary | ICD-10-CM | POA: Insufficient documentation

## 2018-01-09 DIAGNOSIS — S20212A Contusion of left front wall of thorax, initial encounter: Secondary | ICD-10-CM | POA: Insufficient documentation

## 2018-01-09 DIAGNOSIS — R918 Other nonspecific abnormal finding of lung field: Secondary | ICD-10-CM | POA: Diagnosis not present

## 2018-01-09 DIAGNOSIS — R109 Unspecified abdominal pain: Secondary | ICD-10-CM | POA: Diagnosis not present

## 2018-01-09 DIAGNOSIS — W19XXXA Unspecified fall, initial encounter: Secondary | ICD-10-CM

## 2018-01-09 DIAGNOSIS — S3991XA Unspecified injury of abdomen, initial encounter: Secondary | ICD-10-CM | POA: Diagnosis not present

## 2018-01-09 DIAGNOSIS — R9431 Abnormal electrocardiogram [ECG] [EKG]: Secondary | ICD-10-CM | POA: Diagnosis not present

## 2018-01-09 LAB — CBC WITH DIFFERENTIAL/PLATELET
ABS IMMATURE GRANULOCYTES: 0.07 10*3/uL (ref 0.00–0.07)
BASOS ABS: 0.1 10*3/uL (ref 0.0–0.1)
Basophils Relative: 1 %
Eosinophils Absolute: 0 10*3/uL (ref 0.0–0.5)
Eosinophils Relative: 0 %
HEMATOCRIT: 42.2 % (ref 36.0–46.0)
Hemoglobin: 13.9 g/dL (ref 12.0–15.0)
Immature Granulocytes: 1 %
LYMPHS ABS: 1.1 10*3/uL (ref 0.7–4.0)
Lymphocytes Relative: 16 %
MCH: 29.5 pg (ref 26.0–34.0)
MCHC: 32.9 g/dL (ref 30.0–36.0)
MCV: 89.6 fL (ref 80.0–100.0)
Monocytes Absolute: 0.5 10*3/uL (ref 0.1–1.0)
Monocytes Relative: 8 %
NRBC: 0 % (ref 0.0–0.2)
Neutro Abs: 4.8 10*3/uL (ref 1.7–7.7)
Neutrophils Relative %: 74 %
Platelets: 223 10*3/uL (ref 150–400)
RBC: 4.71 MIL/uL (ref 3.87–5.11)
RDW: 14.8 % (ref 11.5–15.5)
WBC: 6.5 10*3/uL (ref 4.0–10.5)

## 2018-01-09 LAB — BASIC METABOLIC PANEL
Anion gap: 10 (ref 5–15)
BUN: 6 mg/dL — ABNORMAL LOW (ref 8–23)
CALCIUM: 9.3 mg/dL (ref 8.9–10.3)
CO2: 25 mmol/L (ref 22–32)
Chloride: 103 mmol/L (ref 98–111)
Creatinine, Ser: 0.76 mg/dL (ref 0.44–1.00)
GFR calc non Af Amer: >60 mL/min (ref >60)
Glucose, Bld: 91 mg/dL (ref 70–99)
Potassium: 3.5 mmol/L (ref 3.5–5.1)
Sodium: 138 mmol/L (ref 135–145)

## 2018-01-09 MED ORDER — MORPHINE SULFATE (PF) 2 MG/ML IV SOLN
2.0000 mg | Freq: Once | INTRAVENOUS | Status: AC
  Administered 2018-01-09: 2 mg via INTRAVENOUS
  Filled 2018-01-09: qty 1

## 2018-01-09 MED ORDER — IOHEXOL 300 MG/ML  SOLN
100.0000 mL | Freq: Once | INTRAMUSCULAR | Status: AC | PRN
  Administered 2018-01-09: 100 mL via INTRAVENOUS

## 2018-01-09 MED ORDER — HYDROCODONE-ACETAMINOPHEN 5-325 MG PO TABS: 0.5000 | ORAL_TABLET | Freq: Four times a day (QID) | ORAL | 0 refills | Status: AC | PRN

## 2018-01-09 NOTE — ED Triage Notes (Signed)
Pt. Stated, I fell from a little step stool last night and I have this grabbing feeling on my left side. Denies any other symptoms. Pt is on blood thinners.

## 2018-01-09 NOTE — ED Notes (Signed)
Walked patient to the bathroom patient stated that her side was hurting patient is now back in bed call bell in reach and family at bedside

## 2018-01-09 NOTE — ED Notes (Signed)
Provider at bedside

## 2018-01-09 NOTE — ED Provider Notes (Signed)
[facility] EMERGENCY DEPARTMENT Provider Note   CSN: [id number] Arrival date & time: 01/09/18  1660     History   Chief Complaint Chief Complaint  Patient presents with  . Fall    HPI Jillian Scott is a 82 y.o. female.  HPI  Patient is an 82 year old female with a history of hyperlipidemia, hiatal hernia, constipation, and arthritis presenting for fall 12 hours prior to arrival. She reports that she lost her footing 2 steps up on a step stool and fell onto her left side. Patient denies any headache, dizziness, lightheadedness, chest pain, shortness of breath, syncope or presyncope just prior to or after the fall. Patient denies any head or neck injury. Patient takes 81 mg of ASA but no other blood thinners. She denies bruising or ecchymosis. She reports it hurts to take a deep breath on the left side. She denies hip pain or difficulty walking.   History reviewed. No pertinent past medical history.  Patient Active Problem List   Diagnosis Date Noted  . ARTHRITIS 05/20/2009  . HYPERLIPIDEMIA 05/15/2009  . GERD 05/15/2009  . HIATAL HERNIA 05/15/2009  . CONSTIPATION 05/15/2009  . REFLUX ESOPHAGITIS, HX OF 05/15/2009    Past Surgical History:  Procedure Laterality Date  . ABDOMINAL HYSTERECTOMY       OB History   No obstetric history on file.      Home Medications    Prior to Admission medications   Medication Sig Start Date End Date Taking? Authorizing Provider  CALCIUM-MAGNESIUM-ZINC PO Take 1 tablet by mouth 2 (two) times daily.    [provider]  Cyanocobalamin (VITAMIN B-12 PO) Take 1 tablet by mouth daily.    [provider]  esomeprazole (NEXIUM) 40 MG capsule Take 40 mg by mouth daily before breakfast.    [provider]  HYDROcodone-acetaminophen (NORCO/VICODIN) 5-325 MG tablet Take 0.5 tablets by mouth every 6 (six) hours as needed for up to 3 days. Please take 0.5 to 1 tablets every 6 hours as needed for  pain. 01/09/18 01/12/18  Langston Masker B, PA-C  rosuvastatin (CRESTOR) 40 MG tablet Take 40 mg by mouth daily.    [provider]  VITAMIN D, CHOLECALCIFEROL, PO Take 1 tablet by mouth daily.    [provider]    Family History Family History  Problem Relation Age of Onset  . Breast cancer Neg Hx     Social History Social History   Tobacco Use  . Smoking status: Never Smoker  . Smokeless tobacco: Never Used  Substance Use Topics  . Alcohol use: No  . Drug use: No     Allergies   Patient has no known allergies.   Review of Systems Review of Systems  Constitutional: Negative for chills and fever.  HENT: Negative for congestion and sore throat.   Eyes: Negative for visual disturbance.  Respiratory: Negative for cough, chest tightness and shortness of breath.   Cardiovascular: Negative for chest pain and palpitations.  Gastrointestinal: Negative for abdominal pain, nausea and vomiting.  Genitourinary: Negative for dysuria and flank pain.  Musculoskeletal: Positive for arthralgias and myalgias. Negative for back pain.  Skin: Negative for rash.  Neurological: Negative for dizziness, syncope, light-headedness and headaches.     Physical Exam Updated Vital Signs BP 112/63 (BP Location: Right Arm)   Pulse 71   Temp 98.3 F (36.8 C) (Oral)   Resp 20   Ht 5\' 3"  (1.6 m)   Wt 64.9 kg   SpO2 99%  BMI 25.33 kg/m   Physical Exam Vitals signs and nursing note reviewed.  Constitutional:      General: She is not in acute distress.    Appearance: She is well-developed.  HENT:     Head: Normocephalic and atraumatic.  Eyes:     Conjunctiva/sclera: Conjunctivae normal.     Pupils: Pupils are equal, round, and reactive to light.  Neck:     Musculoskeletal: Normal range of motion and neck supple.  Cardiovascular:     Rate and Rhythm: Normal rate and regular rhythm.     Heart sounds: S1 normal and S2 normal. No murmur.  Pulmonary:     Effort:  Pulmonary effort is normal.     Breath sounds: Normal breath sounds. No wheezing or rales.     Comments: No tachypnea or increased work of breathing on exam.  Abdominal:     General: There is no distension.     Palpations: Abdomen is soft.     Tenderness: There is no abdominal tenderness. There is no guarding.     Comments: No abdominal ecchymosis.   Musculoskeletal: Normal range of motion.        General: Tenderness present. No deformity.     Comments: No ecchymosis of bilateral flanks. Patient has TTP of the left lower thoracic region. No crepitus or step off appreciated.  No ecchymosis or erythema of back. No midline TTP of cervical, thoracic, or lumbar spine. No crepitus. No step off.  Joints of bilateral shoulders, elbows, wrists, hips, knees, and ankles palpated and are nontender.   Lymphadenopathy:     Cervical: No cervical adenopathy.  Skin:    General: Skin is warm and dry.     Findings: No erythema or rash.  Neurological:     Mental Status: She is alert.     Comments: Cranial nerves grossly intact. Patient moves extremities symmetrically and with good coordination.  Psychiatric:        Behavior: Behavior normal.        Thought Content: Thought content normal.        Judgment: Judgment normal.      ED Treatments / Results  Labs (all labs ordered are listed, but only abnormal results are displayed) Labs Reviewed  BASIC METABOLIC PANEL - Abnormal; Notable for the following components:      Result Value   BUN 6 (*)    All other components within normal limits  CBC WITH DIFFERENTIAL/PLATELET    EKG EKG Interpretation  Date/Time:  Monday January 09 2018 09:18:06 EST Ventricular Rate:  79 PR Interval:  166 QRS Duration: 70 QT Interval:  376 QTC Calculation: 431 R Axis:   35 Text Interpretation:  Normal sinus rhythm Low voltage QRS Cannot rule out Anterior infarct , age undetermined Abnormal ECG Confirmed by Julianne Rice [phone]) on 01/09/2018 1:01:58  PM   Radiology Ct Chest W Contrast  Result Date: 01/09/2018 CLINICAL DATA:  [place] from step stool, landing on the left side with left-sided pain. EXAM: CT CHEST, ABDOMEN, AND PELVIS WITH CONTRAST TECHNIQUE: Multidetector CT imaging of the chest, abdomen and pelvis was performed following the standard protocol during bolus administration of intravenous contrast. CONTRAST:  132mL OMNIPAQUE IOHEXOL 300 MG/ML  SOLN COMPARISON:  None. FINDINGS: CT CHEST FINDINGS Cardiovascular: Heart size is normal allowing for the narrow AP diameter of the chest. There is some coronary artery calcifications. There is a small pericardial effusion. At the right cardiophrenic angle, there is a 4 cm fluid collection that could represent  a pericardial diverticulum or a benign pericardial cyst. The aorta shows atherosclerotic change but no evidence of dissection or injury. Mediastinum/Nodes: No mass or adenopathy. Lungs/Pleura: No pneumothorax or hemothorax. There is a 14 mm spiculated mass in the anterior right upper lobe axial image 47 worrisome for lung cancer. Musculoskeletal: No evidence of spinal or sternal fracture. No clavicle fracture. No shoulder region fracture seen. No rib fracture. CT ABDOMEN PELVIS FINDINGS Hepatobiliary: 14 mm cyst in the posterior upper right lobe. No other liver parenchymal finding. No calcified gallstones. Pancreas: Normal Spleen: Normal. No evidence of splenic injury. Adrenals/Urinary Tract: Adrenal glands are normal. Kidneys are normal. No cyst, mass, stone or hydronephrosis. Bladder is normal. Stomach/Bowel: No significant bowel finding. Vascular/Lymphatic: Aortic atherosclerosis. No aneurysm. IVC is normal. No retroperitoneal adenopathy. Reproductive: Previous hysterectomy. No pelvic mass. Other: No free fluid or air. Musculoskeletal: No evidence of lumbosacral spinal injury. Chronic degenerative change in bridging osteophytes. No evidence of pelvic or hip fracture. IMPRESSION: 1. No traumatic  finding in the chest, abdomen or pelvis. 2. 14 mm spiculated mass in the right upper lobe worrisome for lung cancer. 3. Small pericardial effusion. 4. 4 cm fluid collection at the right cardiophrenic angle that could represent a pericardial diverticulum or a benign pericardial cyst. Aortic Atherosclerosis (ICD10-I70.0). Electronically Signed   By: Nelson Chimes M.D.   On: 01/09/2018 12:37   Ct Abdomen Pelvis W Contrast  Result Date: 01/09/2018 CLINICAL DATA:  [place] from step stool, landing on the left side with left-sided pain. EXAM: CT CHEST, ABDOMEN, AND PELVIS WITH CONTRAST TECHNIQUE: Multidetector CT imaging of the chest, abdomen and pelvis was performed following the standard protocol during bolus administration of intravenous contrast. CONTRAST:  184mL OMNIPAQUE IOHEXOL 300 MG/ML  SOLN COMPARISON:  None. FINDINGS: CT CHEST FINDINGS Cardiovascular: Heart size is normal allowing for the narrow AP diameter of the chest. There is some coronary artery calcifications. There is a small pericardial effusion. At the right cardiophrenic angle, there is a 4 cm fluid collection that could represent a pericardial diverticulum or a benign pericardial cyst. The aorta shows atherosclerotic change but no evidence of dissection or injury. Mediastinum/Nodes: No mass or adenopathy. Lungs/Pleura: No pneumothorax or hemothorax. There is a 14 mm spiculated mass in the anterior right upper lobe axial image 47 worrisome for lung cancer. Musculoskeletal: No evidence of spinal or sternal fracture. No clavicle fracture. No shoulder region fracture seen. No rib fracture. CT ABDOMEN PELVIS FINDINGS Hepatobiliary: 14 mm cyst in the posterior upper right lobe. No other liver parenchymal finding. No calcified gallstones. Pancreas: Normal Spleen: Normal. No evidence of splenic injury. Adrenals/Urinary Tract: Adrenal glands are normal. Kidneys are normal. No cyst, mass, stone or hydronephrosis. Bladder is normal. Stomach/Bowel: No  significant bowel finding. Vascular/Lymphatic: Aortic atherosclerosis. No aneurysm. IVC is normal. No retroperitoneal adenopathy. Reproductive: Previous hysterectomy. No pelvic mass. Other: No free fluid or air. Musculoskeletal: No evidence of lumbosacral spinal injury. Chronic degenerative change in bridging osteophytes. No evidence of pelvic or hip fracture. IMPRESSION: 1. No traumatic finding in the chest, abdomen or pelvis. 2. 14 mm spiculated mass in the right upper lobe worrisome for lung cancer. 3. Small pericardial effusion. 4. 4 cm fluid collection at the right cardiophrenic angle that could represent a pericardial diverticulum or a benign pericardial cyst. Aortic Atherosclerosis (ICD10-I70.0). Electronically Signed   By: Nelson Chimes M.D.   On: 01/09/2018 12:37   Dg Chest Portable 1 View  Result Date: 01/09/2018 CLINICAL DATA:  Status post fall, rib pain  EXAM: PORTABLE CHEST 1 VIEW COMPARISON:  04/16/2015 FINDINGS: There is no focal consolidation. There is mild bilateral interstitial thickening. There is no pleural effusion or pneumothorax. There is stable cardiomegaly. The osseous structures are unremarkable. IMPRESSION: No active disease. Electronically Signed   By: Kathreen Devoid   On: 01/09/2018 09:24    Procedures Procedures (including critical care time)  Medications Ordered in ED Medications  morphine 2 MG/ML injection 2 mg (2 mg Intravenous Given 01/09/18 1127)  iohexol (OMNIPAQUE) 300 MG/ML solution 100 mL (100 mLs Intravenous Contrast Given 01/09/18 1212)     Initial Impression / Assessment and Plan / ED Course  I have reviewed the triage vital signs and the nursing notes.  Pertinent labs & imaging results that were available during my care of the patient were reviewed by me and considered in my medical decision making (see chart for details).     Patient is nontoxic appearing, neurologically intact, and in no acute distress. Do not suspect cardiogenic or neurologic cause  of fall from step ladder. Will assess injuries with CT given high risk for multiple rib fractures.  Imaging reveals no acute finding in the chest/abdomen/pelvis from trauma. Patient does have multiple incidental findings. Patient has spiculated RUL mass concerning per radiology for lung cancer. Patient has incidental small pericardial effusion and small fluid collection at right CVA with ddx per radiology of pericardial diverticulum or benign pericardial cyst. Imaging results reviewed with attending physician, Dr. Julianne Rice. Recommended PCP follow up for all findings as they are chronic.   Patient informed of findings. Call placed to patient's PCP to make follow up appointment in 3 days at [facility].   Suspect rib contusion from fall. Patient given incentive spirometer. Patient prescribed low dose Norco for pain and instructed not to drive, drink alcohol, or operate machinery while taking this medication. I have reviewed the patient's information in the [facility] for the past 12 months and found them to have no Rx on record. Opiates were prescribed for an acute, painful condition. The patient was given information on side effects and encouraged to use other, non-opiate pain medication primary, only using opiate medicine sparingly for severe pain.  Discussed fall prevention with the patient. Encouraged assistance when performing tasks that require ladders.   This is a supervised visit with Dr. Julianne Rice. Evaluation, management, and discharge planning discussed with this attending physician.  Final Clinical Impressions(s) / ED Diagnoses   Final diagnoses:  Fall, initial encounter  Contusion of rib on left side, initial encounter  Lung mass    ED Discharge Orders         Ordered    HYDROcodone-acetaminophen (NORCO/VICODIN) 5-325 MG tablet  Every 6 hours PRN     01/09/18 1345           Tamala Julian 01/10/18  [id number]    Julianne Rice, MD 01/10/18 [phone]

## 2018-01-09 NOTE — Discharge Instructions (Signed)
Please see the information and instructions below regarding your visit.  Your diagnoses today include:  1. Fall, initial encounter   2. Contusion of rib on left side, initial encounter     Tests performed today include: See side panel of your discharge paperwork for testing performed today. Vital signs are listed at the bottom of these instructions.   I made an appointment with your primary care provider to follow-up on your imaging this December 26 at 2:30 PM.  Medications prescribed:    Take any prescribed medications only as prescribed, and any over the counter medications only as directed on the packaging.  You have been prescribed Norco for pain. This is an opioid pain medication. You may take this medication every 4-6 hours as needed for pain. Only take this medication if you need it for breakthrough pain.  You may take 0.5 to 1 tablets every 6 hours.  Do not combine this medication with Tylenol, as it may increase the risk of liver problems.  Do not combine this medication with alcohol.  Please be advised to avoid driving or operating heavy machinery while taking this medication, as it may make you drowsy or impair judgment.  Avoid any activity requiring high mental acuity for 48 hours after finishing this medication.   Home care instructions:  Please follow any educational materials contained in this packet.   Follow-up instructions: Please follow-up with your primary care provider on December 26 at 2:30pm for further evaluation.   Return instructions:  Please return to the Emergency Department if you experience worsening symptoms.  Please return to the emergency department immediately if you develop any worsening pain, difficulty breathing, shortness of breath, difficulty walking, or any new or worsening symptoms. Please return if you have any other emergent concerns.  Additional Information:   Your vital signs today were: BP (!) 122/58 (BP Location: Right Arm)     Pulse 66    Temp 98.3 F (36.8 C) (Oral)    Resp 16    Ht 5\' 3"  (1.6 m)    Wt 64.9 kg    SpO2 100%    BMI 25.33 kg/m  If your blood pressure (BP) was elevated on multiple readings during this visit above 130 for the top number or above 80 for the bottom number, please have this repeated by your primary care provider within one month. --------------  Thank you for allowing [place] to participate in your care today.

## 2018-01-09 NOTE — ED Notes (Addendum)
Patient transported to CT 

## 2018-01-09 NOTE — ED Notes (Signed)
Notified CT patient currently has an IV.

## 2018-01-09 NOTE — ED Notes (Signed)
IV attempted x2 by 2 RN's without success, IV team consulted

## 2018-01-12 DIAGNOSIS — Z23 Encounter for immunization: Secondary | ICD-10-CM | POA: Diagnosis not present

## 2018-01-12 DIAGNOSIS — R911 Solitary pulmonary nodule: Secondary | ICD-10-CM | POA: Diagnosis not present

## 2018-01-12 DIAGNOSIS — R079 Chest pain, unspecified: Secondary | ICD-10-CM | POA: Diagnosis not present

## 2018-01-12 DIAGNOSIS — W19XXXD Unspecified fall, subsequent encounter: Secondary | ICD-10-CM | POA: Diagnosis not present

## 2018-01-19 ENCOUNTER — Ambulatory Visit: Payer: Medicare Other | Admitting: Pulmonary Disease

## 2018-01-19 ENCOUNTER — Encounter: Payer: Self-pay | Admitting: Pulmonary Disease

## 2018-01-19 DIAGNOSIS — R911 Solitary pulmonary nodule: Secondary | ICD-10-CM | POA: Diagnosis not present

## 2018-01-19 DIAGNOSIS — C3490 Malignant neoplasm of unspecified part of unspecified bronchus or lung: Secondary | ICD-10-CM | POA: Insufficient documentation

## 2018-01-19 DIAGNOSIS — R918 Other nonspecific abnormal finding of lung field: Secondary | ICD-10-CM

## 2018-01-19 NOTE — Patient Instructions (Signed)
You have a 15 mm nodule in the right upper lung. Repeat CT chest without contrast in 3 months

## 2018-01-19 NOTE — Progress Notes (Signed)
Subjective:    Patient ID: Jillian Scott, female    DOB: [birth year], 83 y.o.   MRN: [id number]  HPI  Chief Complaint  Patient presents with  . Pulm Consult    Referred by Dr. Deland Pretty for lung mass. This was discovered after her ED visit last month. Denies any breathing concerns.      83 year old never smoker presents for evaluation of incidental finding of pulmonary nodule. She had an accidental fall from a stepstool and she landed on her left side and went to the emergency room for evaluation of left-sided pain on 12/23 CT chest/abdomen/pelvis with contrast did not show any evidence of traumatic findings.  This picked up a small pericardial effusion in the 4 cm fluid collection at the right cardiophrenic angle that could be a benign pericardial cyst.  It also showed a 14 mm spiculated nodule in the right upper lobe.  I have personally reviewed these films.  She denies cough, sputum production, wheezing or hemoptysis.  She denies any fevers or weight loss or sick contacts.  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 Marital status: Single    Spouse name: Not on file  . Number of children: Not on file  . Years of education: Not on file  . Highest education level: Not on file  Occupational History  . Not on file  Social Needs  . Financial resource strain: Not on file  . Food insecurity:    Worry: Not on file    Inability: Not on file  . Transportation needs:    Medical: Not on file    Non-medical: Not on file  Tobacco Use  . Smoking status: Never Smoker  . Smokeless tobacco: Never Used  Substance and Sexual Activity  . Alcohol use: No  . Drug use: No  . Sexual activity: Not on file  Lifestyle  .  Physical activity:    Days per week: Not on file    Minutes per session: Not on file  . Stress: Not on file  Relationships  . Social connections:    Talks on phone: Not on file    Gets together: Not on file    Attends religious service: Not on file    Active member of club or organization: Not on file    Attends meetings of clubs or organizations: Not on file    Relationship status: Not on file  . Intimate partner violence:    Fear of current or ex partner: Not on file    Emotionally abused: Not on file    Physically abused: Not on file    Forced sexual activity: Not on file  Other Topics Concern  . Not on file  Social History Narrative  . Not on file    Family history-son is in good health, no family history of lung cancer      Review of Systems Constitutional: negative for anorexia, fevers and sweats  Eyes: negative for irritation, redness and visual disturbance  Ears, nose, mouth, throat, and face: negative for earaches, epistaxis, nasal congestion and sore throat  Respiratory: negative for cough, dyspnea on exertion, sputum and wheezing  Cardiovascular: negative for chest pain, dyspnea, lower extremity edema, orthopnea, palpitations and syncope  Gastrointestinal: negative  for abdominal pain, constipation, diarrhea, melena, nausea and vomiting  Genitourinary:negative for dysuria, frequency and hematuria  Hematologic/lymphatic: negative for bleeding, easy bruising and lymphadenopathy  Musculoskeletal:negative for arthralgias, muscle weakness and stiff joints  Neurological: negative for coordination problems, gait problems, headaches and weakness  Endocrine: negative for diabetic symptoms including polydipsia, polyuria and weight loss     Objective:   Physical Exam  Gen. Pleasant, elderly, well-nourished, in no distress, anxious  affect ENT - no pallor,icterus, no post nasal drip Neck: No JVD, no thyromegaly, no carotid bruits Lungs: no use of accessory muscles, no  dullness to percussion, clear without rales or rhonchi  Cardiovascular: Rhythm regular, heart sounds  normal, no murmurs or gallops, no peripheral edema Abdomen: soft and non-tender, no hepatosplenomegaly, BS normal. Musculoskeletal: No deformities, no cyanosis or clubbing Neuro:  alert, non focal       Assessment & Plan:

## 2018-01-19 NOTE — Assessment & Plan Note (Signed)
14 mm nodule in the right upper lobe, not really seen on corresponding chest x-ray.  No prior CT imaging available so this is presumably new finding.  Appearance does not help to distinguish between benign and malignant causes. Due to intermediate risk, best approach would be serial follow-up.  We discussed more aggressive approach of a PET scan, there does seem to be an airway leading into the nodule. She preferred a less aggressive approach of a follow-up CT chest without contrast in 3 months and if there is growth in the nodule then we will consider a more aggressive approach  Greater than 50% time of 40 minute visit was spent in counseling and coordination of care with the patient

## 2018-02-08 DIAGNOSIS — E7800 Pure hypercholesterolemia, unspecified: Secondary | ICD-10-CM | POA: Diagnosis not present

## 2018-03-06 ENCOUNTER — Other Ambulatory Visit: Payer: Self-pay | Admitting: Internal Medicine

## 2018-03-06 DIAGNOSIS — Z1231 Encounter for screening mammogram for malignant neoplasm of breast: Secondary | ICD-10-CM

## 2018-04-06 ENCOUNTER — Inpatient Hospital Stay: Admission: RE | Admit: 2018-04-06 | Payer: Self-pay | Source: Ambulatory Visit

## 2018-04-12 ENCOUNTER — Telehealth: Payer: Self-pay | Admitting: *Deleted

## 2018-04-12 NOTE — Telephone Encounter (Signed)
Called patient made her aware DR [name] wanted to push her CT appt to late April due to [place]. Pt wanted Monday scheduled for 5/4 pt aware of appt and to call office if needed.

## 2018-04-21 ENCOUNTER — Other Ambulatory Visit: Payer: Self-pay

## 2018-04-24 ENCOUNTER — Ambulatory Visit: Payer: Self-pay | Admitting: Pulmonary Disease

## 2018-05-19 ENCOUNTER — Telehealth: Payer: Self-pay

## 2018-05-19 NOTE — Telephone Encounter (Signed)
Called pt to confirm appt.  Left message for pt Benjie Karvonen, CMA

## 2018-05-22 ENCOUNTER — Ambulatory Visit (INDEPENDENT_AMBULATORY_CARE_PROVIDER_SITE_OTHER)
Admission: RE | Admit: 2018-05-22 | Discharge: 2018-05-22 | Disposition: A | Discharge status: Home / Self Care | Payer: Medicare Other | Source: Ambulatory Visit | Attending: Pulmonary Disease | Admitting: Pulmonary Disease

## 2018-05-22 ENCOUNTER — Other Ambulatory Visit: Payer: Self-pay

## 2018-05-22 DIAGNOSIS — R918 Other nonspecific abnormal finding of lung field: Secondary | ICD-10-CM | POA: Diagnosis not present

## 2018-05-22 DIAGNOSIS — R0602 Shortness of breath: Secondary | ICD-10-CM | POA: Diagnosis not present

## 2018-05-25 ENCOUNTER — Ambulatory Visit (INDEPENDENT_AMBULATORY_CARE_PROVIDER_SITE_OTHER): Payer: Medicare Other | Admitting: Primary Care

## 2018-05-25 ENCOUNTER — Encounter: Payer: Self-pay | Admitting: Primary Care

## 2018-05-25 ENCOUNTER — Other Ambulatory Visit: Payer: Self-pay

## 2018-05-25 DIAGNOSIS — R911 Solitary pulmonary nodule: Secondary | ICD-10-CM | POA: Diagnosis not present

## 2018-05-25 NOTE — Progress Notes (Signed)
Virtual Visit via Video Note  I connected with Jillian Scott on 05/25/18 at  9:00 AM EDT by a video enabled telemedicine application and verified that I am speaking with the correct person using two identifiers.  Location: Patient: Home Provider: Office   I discussed the limitations of evaluation and management by telemedicine and the availability of in person appointments. The patient expressed understanding and agreed to proceed.  History of Present Illness: 83 year old female, never smoked (several family members smoked). PMH solitary pulmonary nodule. Patient of Dr. [name], seen for initial consult on 01/19/18. Patient had an incidental finding on CT chest/abdomen/pelvis in December 2019 that showed 62mm right upper lung nodule.   05/25/2018 Called patient today for 3 month office visit and review recent testing. Patient is doing well, no acute complaints except for GERD symptoms. Breathing is good. Denies weight loss, cough, hemoptysis, aspiration or swallowing issues. Follow up CT chest in May showed grossly stable 83mm spiculated density in the right upper lobe. Plan PET scan in 3 months.    Observations/Objective:  - No shortness of breath, wheezing or cough - Very pleasant   Assessment and Plan:  Solitary nodule: - Never smoked (passive exposure) - Repeat CT chest shows stable 32mm spiculated density RUL - Needs PET scan in 3-4 months with OV   GERD: - Reviewed GERD diet - Follow up with PCP, may need GI referral if persist   Follow Up Instructions:   - Follow up with Dr. [name] in 3-4 month after PET scan   I discussed the assessment and treatment plan with the patient. The patient was provided an opportunity to ask questions and all were answered. The patient agreed with the plan and demonstrated an understanding of the instructions.   The patient was advised to call back or seek an in-person evaluation if the symptoms worsen or if the condition fails to improve as  anticipated.  I provided 15 minutes of non-face-to-face time during this encounter.   Martyn Ehrich, NP

## 2018-05-25 NOTE — Patient Instructions (Addendum)
CT chest in May showed stable 13mm right upper lobe pulmonary nodule Needs PET scan in 3-4 months  Follow up with Dr. [name] in 3-4 months    Food Choices for Gastroesophageal Reflux Disease, Adult When you have gastroesophageal reflux disease (GERD), the foods you eat and your eating habits are very important. Choosing the right foods can help ease your discomfort. Think about working with a nutrition specialist (dietitian) to help you make good choices. What are tips for following this plan?  Meals  Choose healthy foods that are low in fat, such as fruits, vegetables, whole grains, low-fat dairy products, and lean meat, fish, and poultry.  Eat small meals often instead of 3 large meals a day. Eat your meals slowly, and in a place where you are relaxed. Avoid bending over or lying down until 2-3 hours after eating.  Avoid eating meals 2-3 hours before bed.  Avoid drinking a lot of liquid with meals.  Cook foods using methods other than frying. Bake, grill, or broil food instead.  Avoid or limit: ? Chocolate. ? Peppermint or spearmint. ? Alcohol. ? Pepper. ? Black and decaffeinated coffee. ? Black and decaffeinated tea. ? Bubbly (carbonated) soft drinks. ? Caffeinated energy drinks and soft drinks.  Limit high-fat foods such as: ? Fatty meat or fried foods. ? Whole milk, cream, butter, or ice cream. ? Nuts and nut butters. ? Pastries, donuts, and sweets made with butter or shortening.  Avoid foods that cause symptoms. These foods may be different for everyone. Common foods that cause symptoms include: ? Tomatoes. ? Oranges, lemons, and limes. ? Peppers. ? Spicy food. ? Onions and garlic. ? Vinegar. Lifestyle  Maintain a healthy weight. Ask your doctor what weight is healthy for you. If you need to lose weight, work with your doctor to do so safely.  Exercise for at least 30 minutes for 5 or more days each week, or as told by your doctor.  Wear loose-fitting  clothes.  Do not smoke. If you need help quitting, ask your doctor.  Sleep with the head of your bed higher than your feet. Use a wedge under the mattress or blocks under the bed frame to raise the head of the bed. Summary  When you have gastroesophageal reflux disease (GERD), food and lifestyle choices are very important in easing your symptoms.  Eat small meals often instead of 3 large meals a day. Eat your meals slowly, and in a place where you are relaxed.  Limit high-fat foods such as fatty meat or fried foods.  Avoid bending over or lying down until 2-3 hours after eating.  Avoid peppermint and spearmint, caffeine, alcohol, and chocolate. This information is not intended to replace advice given to you by your health care provider. Make sure you discuss any questions you have with your health care provider. Document Released: 07/06/2011 Document Revised: 02/10/2016 Document Reviewed: 02/10/2016 Elsevier Interactive Patient Education  2019 [employer].

## 2018-08-10 DIAGNOSIS — E785 Hyperlipidemia, unspecified: Secondary | ICD-10-CM | POA: Diagnosis not present

## 2018-08-10 DIAGNOSIS — E559 Vitamin D deficiency, unspecified: Secondary | ICD-10-CM | POA: Diagnosis not present

## 2018-08-10 DIAGNOSIS — E039 Hypothyroidism, unspecified: Secondary | ICD-10-CM | POA: Diagnosis not present

## 2018-08-10 DIAGNOSIS — E7800 Pure hypercholesterolemia, unspecified: Secondary | ICD-10-CM | POA: Diagnosis not present

## 2018-08-10 DIAGNOSIS — E538 Deficiency of other specified B group vitamins: Secondary | ICD-10-CM | POA: Diagnosis not present

## 2018-08-14 ENCOUNTER — Telehealth: Payer: Self-pay | Admitting: Primary Care

## 2018-08-14 DIAGNOSIS — Z0000 Encounter for general adult medical examination without abnormal findings: Secondary | ICD-10-CM | POA: Diagnosis not present

## 2018-08-14 DIAGNOSIS — E039 Hypothyroidism, unspecified: Secondary | ICD-10-CM | POA: Diagnosis not present

## 2018-08-14 DIAGNOSIS — E785 Hyperlipidemia, unspecified: Secondary | ICD-10-CM | POA: Diagnosis not present

## 2018-08-14 NOTE — Telephone Encounter (Signed)
Assessment and Plan from OV with EW 05/25/2018:  Solitary nodule: - Never smoked (passive exposure) - Repeat CT chest shows stable 50mm spiculated density RUL - Needs PET scan in 3-4 months with OV   Called and spoke with pt letting her know that per last OV, PET is needed so we will keep it as scheduled. Pt verbalized understanding. I have also scheduled pt a f/u OV with EW 8/6 to go over results of PET. Nothing further needed.

## 2018-08-21 ENCOUNTER — Other Ambulatory Visit: Payer: Self-pay

## 2018-08-21 ENCOUNTER — Encounter (HOSPITAL_COMMUNITY)
Admission: RE | Admit: 2018-08-21 | Discharge: 2018-08-21 | Disposition: A | Discharge status: Home / Self Care | Payer: Medicare Other | Source: Ambulatory Visit | Attending: Primary Care | Admitting: Primary Care

## 2018-08-21 DIAGNOSIS — E039 Hypothyroidism, unspecified: Secondary | ICD-10-CM | POA: Insufficient documentation

## 2018-08-21 DIAGNOSIS — R911 Solitary pulmonary nodule: Secondary | ICD-10-CM | POA: Diagnosis not present

## 2018-08-21 DIAGNOSIS — Z79899 Other long term (current) drug therapy: Secondary | ICD-10-CM | POA: Diagnosis not present

## 2018-08-21 DIAGNOSIS — Z79890 Hormone replacement therapy (postmenopausal): Secondary | ICD-10-CM | POA: Insufficient documentation

## 2018-08-21 DIAGNOSIS — E785 Hyperlipidemia, unspecified: Secondary | ICD-10-CM | POA: Diagnosis not present

## 2018-08-21 DIAGNOSIS — Z7982 Long term (current) use of aspirin: Secondary | ICD-10-CM | POA: Diagnosis not present

## 2018-08-21 LAB — GLUCOSE, CAPILLARY: Glucose-Capillary: 87 mg/dL (ref 70–99)

## 2018-08-21 MED ORDER — FLUDEOXYGLUCOSE F - 18 (FDG) INJECTION
6.7400 | Freq: Once | INTRAVENOUS | Status: AC | PRN
  Administered 2018-08-21: 11:00:00 6.74 via INTRAVENOUS

## 2018-08-22 ENCOUNTER — Other Ambulatory Visit: Payer: Self-pay | Admitting: Primary Care

## 2018-08-22 DIAGNOSIS — D369 Benign neoplasm, unspecified site: Secondary | ICD-10-CM

## 2018-08-22 DIAGNOSIS — R911 Solitary pulmonary nodule: Secondary | ICD-10-CM

## 2018-08-22 NOTE — Progress Notes (Signed)
Please refer patient to ENT re papilloma and order CT chest with contrast in 6 months. Discussed result with patient she in understanding and agrees with plan.

## 2018-08-24 ENCOUNTER — Other Ambulatory Visit: Payer: Self-pay

## 2018-08-24 ENCOUNTER — Telehealth: Payer: Self-pay | Admitting: Primary Care

## 2018-08-24 ENCOUNTER — Ambulatory Visit: Payer: Medicare Other | Admitting: Primary Care

## 2018-08-24 ENCOUNTER — Encounter: Payer: Self-pay | Admitting: Primary Care

## 2018-08-24 VITALS — BP 130/74 | HR 86 | Temp 98.5°F | Ht 63.0 in | Wt 126.0 lb

## 2018-08-24 DIAGNOSIS — R220 Localized swelling, mass and lump, head: Secondary | ICD-10-CM

## 2018-08-24 DIAGNOSIS — R911 Solitary pulmonary nodule: Secondary | ICD-10-CM | POA: Diagnosis not present

## 2018-08-24 DIAGNOSIS — J3489 Other specified disorders of nose and nasal sinuses: Secondary | ICD-10-CM | POA: Insufficient documentation

## 2018-08-24 NOTE — Progress Notes (Signed)
@Patient  ID: Jillian Scott, female    DOB: November 29, 1935, 83 y.o.   MRN: [id number]  Chief Complaint  Patient presents with  . Follow-up    here for ct results    Referring provider: [name], MD  HPI: 83 year old female, never smoked (several family members smoked). PMH solitary pulmonary nodule. Patient of Dr. [name], seen for initial consult on 01/19/18. Patient had an incidental finding on CT chest/abdomen/pelvis in December 2019 that showed 56mm right upper lung nodule.   Previous [place] encounter 05/25/2018 Called patient today for 3 month office visit and review recent testing. Patient is doing well, no acute complaints except for GERD symptoms. Breathing is good. Denies weight loss, cough, hemoptysis, aspiration or swallowing issues. Follow up CT chest in May showed grossly stable 81mm spiculated density in the right upper lobe. Plan PET scan in 3 months.   08/24/2018 Patient presents today to review PET scan results. Feels well, no acute complaints. No significant shortness of breath. Denies cough, sinus pain or nasal congestion. PET scan on 08/21/18 showed spiculated central cavitary RUL nodule measuring 1.2 x1.1cm demonstrating a low level hypermetabolic activity with an SUV mas of 2.3. No other suspicious pulmonary nodules. No hypermetabolic mediastinal, hilar or axillary lymph nodes. 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 This has an SUV max of 22.1. Discuss findings with patient and answered all questions. She is understanding and agreeing with plan. Refer to ENT and repeat CT chest in 6 months.    No Known Allergies  Immunization History  Administered Date(s) Administered  . Zoster Recombinat (Shingrix) 08/14/2018    Past Medical History:  Diagnosis Date  . Hyperlipidemia   . Hypothyroidism     Tobacco History: Social History   Tobacco Use  Smoking Status Never Smoker  Smokeless  Tobacco Never Used   Counseling given: Not Answered   Outpatient Medications Prior to Visit  Medication Sig Dispense Refill  . aspirin EC 81 MG tablet Take 81 mg by mouth daily.    . calcium-vitamin D (OSCAL WITH D) 500-200 MG-UNIT tablet Take 1 tablet by mouth.    . cyanocobalamin 1000 MCG tablet Take 1,000 mcg by mouth daily.    . Levothyroxine Sodium (LEVOTHROID PO) Take by mouth.    . rosuvastatin (CRESTOR) 10 MG tablet Take 10 mg by mouth daily.     No facility-administered medications prior to visit.    Review of Systems  Review of Systems  Constitutional: Negative.   HENT: Negative for congestion, postnasal drip, sinus pressure and sinus pain.   Respiratory: Negative for cough, shortness of breath and wheezing.   Cardiovascular: Negative.    Physical Exam  BP 130/74 (BP Location: Left Arm, Patient Position: Sitting, Cuff Size: Normal)   Pulse 86   Temp 98.5 F (36.9 C)   Ht 5\' 3"  (1.6 m)   Wt 126 lb (57.2 kg)   SpO2 98%   BMI 22.32 kg/m  Physical Exam Constitutional:      Appearance: Normal appearance. She is normal weight.  HENT:     Nose: Nose normal. No congestion.  Cardiovascular:     Rate and Rhythm: Normal rate and regular rhythm.     Comments: Regularly irregular; HR 86 Pulmonary:     Effort: Pulmonary effort is normal.     Breath sounds: Normal breath sounds.     Comments: Clear t/o Skin:    General: Skin is warm and dry.  Neurological:  General: No focal deficit present.     Mental Status: She is alert and oriented to person, place, and time. Mental status is at baseline.  Psychiatric:        Mood and Affect: Mood normal.        Behavior: Behavior normal.        Thought Content: Thought content normal.        Judgment: Judgment normal.     Comments: Anxious      Lab Results:  CBC    Component Value Date/Time   WBC 6.5 01/09/2018 0952   RBC 4.71 01/09/2018 0952   HGB 13.9 01/09/2018 0952   HCT 42.2 01/09/2018 0952   PLT 223  01/09/2018 0952   MCV 89.6 01/09/2018 0952   MCH 29.5 01/09/2018 0952   MCHC 32.9 01/09/2018 0952   RDW 14.8 01/09/2018 0952   LYMPHSABS 1.1 01/09/2018 0952   MONOABS 0.5 01/09/2018 0952   EOSABS 0.0 01/09/2018 0952   BASOSABS 0.1 01/09/2018 0952    BMET    Component Value Date/Time   NA 138 01/09/2018 0952   K 3.5 01/09/2018 0952   CL 103 01/09/2018 0952   CO2 25 01/09/2018 0952   GLUCOSE 91 01/09/2018 0952   BUN 6 (L) 01/09/2018 0952   CREATININE 0.76 01/09/2018 0952   CALCIUM 9.3 01/09/2018 0952   GFRNONAA >60 01/09/2018 0952   GFRAA >60 01/09/2018 0952    BNP No results found for: BNP  ProBNP No results found for: PROBNP  Imaging: Nm Pet Image Initial (pi) Skull Base To Thigh  Result Date: 08/21/2018 CLINICAL DATA:  Initial treatment strategy for spiculated right upper lobe pulmonary nodule. EXAM: NUCLEAR MEDICINE PET SKULL BASE TO THIGH TECHNIQUE: 6.74 mCi F-18 FDG was injected intravenously. Full-ring PET imaging was performed from the skull base to thigh after the radiotracer. CT data was obtained and used for attenuation correction and anatomic localization. Fasting blood glucose: 87 mg/dl COMPARISON:  Chest CT 05/22/2018 and 01/09/2018. Abdominopelvic CT 01/09/2018. Maxillofacial CT 05/24/2012. FINDINGS: Mediastinal blood pool activity: SUV max 2.7 Liver activity: SUV max 3.2 NECK: No hypermetabolic cervical lymph nodes are identified.There is some motion artifact on the images through the face, resulting in misregistration between the PET and the CT images. However, there is intense hypermetabolic activity within the right maxillary sinus, corresponding with a 2.3 x 2.1 cm right maxillary sinus soft tissue nodule on image 24/4. This has an SUV max of 22.1. No other suspicious sinus or pharyngeal mucosal space activity. Incidental CT findings: none CHEST: The spiculated and centrally cavitary right upper lobe nodule demonstrates only low level hypermetabolic activity with  an SUV max of 2.3. This nodule measures 1.2 x 1.1 cm on image 20/8. No other suspicious pulmonary nodules. There are no hypermetabolic mediastinal, hilar or axillary lymph nodes. Incidental CT findings: Pericardial cyst at the right cardiophrenic angle is stable, measuring 3.9 x 3.1 cm on image 93/4. This demonstrates no metabolic activity. There is a small pericardial effusion. Atherosclerosis of the aorta, great vessels and coronary arteries noted. There is stable chronic lung disease with biapical scarring. ABDOMEN/PELVIS: There is no hypermetabolic activity within the liver, adrenal glands, spleen or pancreas. There is no hypermetabolic nodal activity. Incidental CT findings: Stable hepatic cysts, aortic and branch vessel atherosclerosis. SKELETON: There is no hypermetabolic activity to suggest osseous metastatic disease. Incidental CT findings: none IMPRESSION: 1. The spiculated and centrally cavitary right upper lobe nodule demonstrates metabolic activity similar to blood pool. Although reassuring, this  nodule is morphologically suspicious and adenocarcinoma is not completely excluded. Referral to multidisciplinary thoracic oncology clinic recommended. 2. No evidence of metastatic disease. 3. 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 ENT referral recommended. Electronically Signed   By: Richardean Sale M.D.   On: 08/21/2018 14:35     Assessment & Plan:   Solitary pulmonary nodule on lung CT - Stable; never smoked, low risk  - PET scan on 08/21/18 spiculated central cavitary RUL nodule measuring 1.2 x1.1cm demonstrating a low level hypermetabolic activity with an SUV mas of 2.3.  - No other suspicious pulmonary nodules. No hypermetabolic mediastinal, hilar or axillary lymph nodes.  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 This has an SUV max of 22.1 - Refer to ENT    Martyn Ehrich, NP 08/24/2018

## 2018-08-24 NOTE — Assessment & Plan Note (Signed)
-   Incidental finding on PET scan - Hypermetabolic right maxillary sinus mass suspicious for inverted papilloma or other sinonasal neoplasm. This has an SUV max of 22.1 - Refer to ENT

## 2018-08-24 NOTE — Patient Instructions (Signed)
CT chest in 6 months to follow right upper lung nodule  Referral to ENT (ear nose and throat) for right sinus mass   Follow up: Dr. [name] in 6 months after CT scan Or soon if needed

## 2018-08-24 NOTE — Telephone Encounter (Signed)
-----   Message from Rigoberto Noel, MD sent at 08/21/2018  5:38 PM EDT ----- Refer to ENT.Follow-up CT chest in 6 months-this is low-level hypermetabolism and needs to be serially followed, no need for referral to [place]  RA ----- Message ----- From: Martyn Ehrich, NP Sent: 08/21/2018   3:30 PM EDT To: Rigoberto Noel, MD  Patient of yours being followed for RUL solitary nodules.  PET scan 08/21/18 for spiculated right upper lobe pulmonary nodule demonstrates metabolic activity similar to blood pool. Although reassuring, this nodule is morphologically suspicious and adenocarcinoma is not completely excluded. Referral to multidisciplinary thoracic oncology clinic recommended.No evidence of metastatic disease.   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 ENT referral Recommended.  Do you want me to refer to [facility] and ENT???  -[facility]

## 2018-08-24 NOTE — Assessment & Plan Note (Addendum)
-   Stable; never smoked, low risk  - PET scan on 08/21/18 spiculated central cavitary RUL nodule measuring 1.2 x1.1cm demonstrating a low level hypermetabolic activity with an SUV mas of 2.3.  - No other suspicious pulmonary nodules. No hypermetabolic mediastinal, hilar or axillary lymph nodes.  - Plan repeat CT chest in 6 months

## 2018-08-29 DIAGNOSIS — J338 Other polyp of sinus: Secondary | ICD-10-CM | POA: Diagnosis not present

## 2018-08-29 DIAGNOSIS — J310 Chronic rhinitis: Secondary | ICD-10-CM | POA: Diagnosis not present

## 2018-09-26 ENCOUNTER — Other Ambulatory Visit: Payer: Self-pay | Admitting: Otolaryngology

## 2018-09-26 DIAGNOSIS — R911 Solitary pulmonary nodule: Secondary | ICD-10-CM

## 2018-10-13 ENCOUNTER — Other Ambulatory Visit: Payer: Self-pay

## 2018-10-13 ENCOUNTER — Encounter (HOSPITAL_BASED_OUTPATIENT_CLINIC_OR_DEPARTMENT_OTHER): Payer: Self-pay | Admitting: *Deleted

## 2018-10-17 ENCOUNTER — Other Ambulatory Visit (HOSPITAL_COMMUNITY)
Admission: RE | Admit: 2018-10-17 | Discharge: 2018-10-17 | Disposition: A | Discharge status: Home / Self Care | Payer: Medicare Other | Source: Ambulatory Visit | Attending: Otolaryngology | Admitting: Otolaryngology

## 2018-10-17 DIAGNOSIS — Z01812 Encounter for preprocedural laboratory examination: Secondary | ICD-10-CM | POA: Diagnosis not present

## 2018-10-17 DIAGNOSIS — Z20828 Contact with and (suspected) exposure to other viral communicable diseases: Secondary | ICD-10-CM | POA: Insufficient documentation

## 2018-10-18 LAB — NOVEL CORONAVIRUS, NAA (HOSP ORDER, SEND-OUT TO REF LAB; TAT 18-24 HRS): SARS-CoV-2, NAA: NOT DETECTED

## 2018-10-20 ENCOUNTER — Ambulatory Visit (HOSPITAL_BASED_OUTPATIENT_CLINIC_OR_DEPARTMENT_OTHER)
Admission: RE | Admit: 2018-10-20 | Discharge: 2018-10-20 | Disposition: A | Discharge status: Home / Self Care | Payer: Medicare Other | Attending: Otolaryngology | Admitting: Otolaryngology

## 2018-10-20 ENCOUNTER — Encounter (HOSPITAL_BASED_OUTPATIENT_CLINIC_OR_DEPARTMENT_OTHER): Payer: Self-pay | Admitting: Emergency Medicine

## 2018-10-20 ENCOUNTER — Encounter (HOSPITAL_BASED_OUTPATIENT_CLINIC_OR_DEPARTMENT_OTHER)
Admission: RE | Disposition: A | Discharge status: Home / Self Care | Payer: Self-pay | Source: Home / Self Care | Attending: Otolaryngology

## 2018-10-20 ENCOUNTER — Other Ambulatory Visit: Payer: Self-pay

## 2018-10-20 ENCOUNTER — Ambulatory Visit (HOSPITAL_BASED_OUTPATIENT_CLINIC_OR_DEPARTMENT_OTHER): Payer: Medicare Other | Admitting: Anesthesiology

## 2018-10-20 DIAGNOSIS — E039 Hypothyroidism, unspecified: Secondary | ICD-10-CM | POA: Diagnosis not present

## 2018-10-20 DIAGNOSIS — J3489 Other specified disorders of nose and nasal sinuses: Secondary | ICD-10-CM | POA: Diagnosis not present

## 2018-10-20 DIAGNOSIS — K219 Gastro-esophageal reflux disease without esophagitis: Secondary | ICD-10-CM | POA: Diagnosis not present

## 2018-10-20 DIAGNOSIS — J320 Chronic maxillary sinusitis: Secondary | ICD-10-CM | POA: Insufficient documentation

## 2018-10-20 DIAGNOSIS — J338 Other polyp of sinus: Secondary | ICD-10-CM | POA: Diagnosis not present

## 2018-10-20 DIAGNOSIS — D140 Benign neoplasm of middle ear, nasal cavity and accessory sinuses: Secondary | ICD-10-CM | POA: Insufficient documentation

## 2018-10-20 DIAGNOSIS — E785 Hyperlipidemia, unspecified: Secondary | ICD-10-CM | POA: Diagnosis not present

## 2018-10-20 DIAGNOSIS — R220 Localized swelling, mass and lump, head: Secondary | ICD-10-CM | POA: Diagnosis present

## 2018-10-20 HISTORY — DX: Other specified disorders of nose and nasal sinuses: J34.89

## 2018-10-20 HISTORY — PX: MAXILLARY ANTROSTOMY: SHX2003

## 2018-10-20 SURGERY — MAXILLARY ANTROSTOMY
Anesthesia: General | Site: Nose | Laterality: Right

## 2018-10-20 MED ORDER — LIDOCAINE 2% (20 MG/ML) 5 ML SYRINGE
INTRAMUSCULAR | Status: DC | PRN
  Administered 2018-10-20: 40 mg via INTRAVENOUS

## 2018-10-20 MED ORDER — LACTATED RINGERS IV SOLN
INTRAVENOUS | Status: DC
  Administered 2018-10-20: 07:00:00 via INTRAVENOUS

## 2018-10-20 MED ORDER — 0.9 % SODIUM CHLORIDE (POUR BTL) OPTIME
TOPICAL | Status: DC | PRN
  Administered 2018-10-20: 09:00:00 120 mL

## 2018-10-20 MED ORDER — DEXAMETHASONE SODIUM PHOSPHATE 4 MG/ML IJ SOLN
INTRAMUSCULAR | Status: DC | PRN
  Administered 2018-10-20: 4 mg via INTRAVENOUS

## 2018-10-20 MED ORDER — ONDANSETRON HCL 4 MG/2ML IJ SOLN
INTRAMUSCULAR | Status: DC | PRN
  Administered 2018-10-20: 4 mg via INTRAVENOUS

## 2018-10-20 MED ORDER — SUGAMMADEX SODIUM 200 MG/2ML IV SOLN
INTRAVENOUS | Status: DC | PRN
  Administered 2018-10-20: 200 mg via INTRAVENOUS

## 2018-10-20 MED ORDER — ROCURONIUM BROMIDE 10 MG/ML (PF) SYRINGE
PREFILLED_SYRINGE | INTRAVENOUS | Status: AC
  Filled 2018-10-20: qty 10

## 2018-10-20 MED ORDER — DEXAMETHASONE SODIUM PHOSPHATE 10 MG/ML IJ SOLN
INTRAMUSCULAR | Status: AC
  Filled 2018-10-20: qty 1

## 2018-10-20 MED ORDER — FENTANYL CITRATE (PF) 100 MCG/2ML IJ SOLN
INTRAMUSCULAR | Status: DC | PRN
  Administered 2018-10-20: 50 ug via INTRAVENOUS
  Administered 2018-10-20: 25 ug via INTRAVENOUS

## 2018-10-20 MED ORDER — FENTANYL CITRATE (PF) 100 MCG/2ML IJ SOLN
INTRAMUSCULAR | Status: AC
  Filled 2018-10-20: qty 2

## 2018-10-20 MED ORDER — LIDOCAINE 2% (20 MG/ML) 5 ML SYRINGE
INTRAMUSCULAR | Status: AC
  Filled 2018-10-20: qty 5

## 2018-10-20 MED ORDER — ONDANSETRON HCL 4 MG/2ML IJ SOLN
INTRAMUSCULAR | Status: AC
  Filled 2018-10-20: qty 2

## 2018-10-20 MED ORDER — PROPOFOL 10 MG/ML IV BOLUS
INTRAVENOUS | Status: DC | PRN
  Administered 2018-10-20: 150 mg via INTRAVENOUS
  Administered 2018-10-20: 30 mg via INTRAVENOUS

## 2018-10-20 MED ORDER — FENTANYL CITRATE (PF) 100 MCG/2ML IJ SOLN: 50.0000 ug | INTRAMUSCULAR | Status: DC | PRN

## 2018-10-20 MED ORDER — AMOXICILLIN 875 MG PO TABS: 875.0000 mg | ORAL_TABLET | Freq: Two times a day (BID) | ORAL | 0 refills | Status: AC

## 2018-10-20 MED ORDER — OXYMETAZOLINE HCL 0.05 % NA SOLN
NASAL | Status: AC
  Filled 2018-10-20: qty 30

## 2018-10-20 MED ORDER — CEFAZOLIN SODIUM-DEXTROSE 2-3 GM-%(50ML) IV SOLR
INTRAVENOUS | Status: DC | PRN
  Administered 2018-10-20: 2 g via INTRAVENOUS

## 2018-10-20 MED ORDER — SCOPOLAMINE 1 MG/3DAYS TD PT72: 1.0000 | MEDICATED_PATCH | Freq: Once | TRANSDERMAL | Status: DC

## 2018-10-20 MED ORDER — ROCURONIUM BROMIDE 10 MG/ML (PF) SYRINGE
PREFILLED_SYRINGE | INTRAVENOUS | Status: DC | PRN
  Administered 2018-10-20: 40 mg via INTRAVENOUS

## 2018-10-20 MED ORDER — MUPIROCIN 2 % EX OINT
TOPICAL_OINTMENT | CUTANEOUS | Status: AC
  Filled 2018-10-20: qty 22

## 2018-10-20 MED ORDER — MIDAZOLAM HCL 2 MG/2ML IJ SOLN: 1.0000 mg | INTRAMUSCULAR | Status: DC | PRN

## 2018-10-20 MED ORDER — OXYCODONE-ACETAMINOPHEN 5-325 MG PO TABS: 1.0000 | ORAL_TABLET | ORAL | 0 refills | Status: AC | PRN

## 2018-10-20 MED ORDER — HYDROMORPHONE HCL 1 MG/ML IJ SOLN: 0.2500 mg | INTRAMUSCULAR | Status: DC | PRN

## 2018-10-20 MED ORDER — MUPIROCIN 2 % EX OINT
TOPICAL_OINTMENT | CUTANEOUS | Status: DC | PRN
  Administered 2018-10-20: 1 via TOPICAL

## 2018-10-20 MED ORDER — OXYMETAZOLINE HCL 0.05 % NA SOLN
NASAL | Status: DC | PRN
  Administered 2018-10-20: 1 via TOPICAL

## 2018-10-20 SURGICAL SUPPLY — 41 items
ATTRACTOMAT 16X20 MAGNETIC DRP (DRAPES) IMPLANT
BLADE RAD40 ROTATE 4M 4 5PK (BLADE) IMPLANT
BLADE RAD60 ROTATE M4 4 5PK (BLADE) IMPLANT
BLADE TRICUT ROTATE M4 4 5PK (BLADE) ×1 IMPLANT
CANISTER SUC SOCK COL 7IN (MISCELLANEOUS) ×3 IMPLANT
CANISTER SUCT 1200ML W/VALVE (MISCELLANEOUS) ×2 IMPLANT
COAGULATOR SUCT 8FR VV (MISCELLANEOUS) ×1 IMPLANT
COVER WAND RF STERILE (DRAPES) IMPLANT
DECANTER SPIKE VIAL GLASS SM (MISCELLANEOUS) IMPLANT
DRSG NASOPORE 8CM (GAUZE/BANDAGES/DRESSINGS) ×1 IMPLANT
ELECT REM PT RETURN 9FT ADLT (ELECTROSURGICAL)
ELECTRODE REM PT RTRN 9FT ADLT (ELECTROSURGICAL) IMPLANT
GLOVE BIO SURGEON STRL SZ7.5 (GLOVE) ×2 IMPLANT
GLOVE BIOGEL PI IND STRL 7.0 (GLOVE) IMPLANT
GLOVE BIOGEL PI INDICATOR 7.0 (GLOVE) ×1
GLOVE SURG SYN 7.5  E (GLOVE) ×1
GLOVE SURG SYN 7.5 E (GLOVE) ×1 IMPLANT
GLOVE SURG SYN 7.5 PF PI (GLOVE) IMPLANT
GOWN STRL REUS W/ TWL LRG LVL3 (GOWN DISPOSABLE) ×2 IMPLANT
GOWN STRL REUS W/ TWL XL LVL3 (GOWN DISPOSABLE) IMPLANT
GOWN STRL REUS W/TWL LRG LVL3 (GOWN DISPOSABLE) ×2
GOWN STRL REUS W/TWL XL LVL3 (GOWN DISPOSABLE) ×2
HEMOSTAT SURGICEL 2X14 (HEMOSTASIS) IMPLANT
IV NS 500ML (IV SOLUTION) ×2
IV NS 500ML BAXH (IV SOLUTION) ×1 IMPLANT
NDL HYPO 25X1 1.5 SAFETY (NEEDLE) ×1 IMPLANT
NEEDLE HYPO 25X1 1.5 SAFETY (NEEDLE) IMPLANT
NS IRRIG 1000ML POUR BTL (IV SOLUTION) ×1 IMPLANT
PACK BASIN DAY SURGERY FS (CUSTOM PROCEDURE TRAY) ×2 IMPLANT
PACK ENT DAY SURGERY (CUSTOM PROCEDURE TRAY) ×2 IMPLANT
SLEEVE SCD COMPRESS KNEE MED (MISCELLANEOUS) ×1 IMPLANT
SOLUTION BUTLER CLEAR DIP (MISCELLANEOUS) ×2 IMPLANT
SPONGE GAUZE 2X2 8PLY STRL LF (GAUZE/BANDAGES/DRESSINGS) ×2 IMPLANT
SPONGE NEURO XRAY DETECT 1X3 (DISPOSABLE) ×2 IMPLANT
SUCTION FRAZIER HANDLE 10FR (MISCELLANEOUS)
SUCTION TUBE FRAZIER 10FR DISP (MISCELLANEOUS) IMPLANT
SYR 50ML LL SCALE MARK (SYRINGE) ×1 IMPLANT
TOWEL GREEN STERILE FF (TOWEL DISPOSABLE) ×2 IMPLANT
TUBE CONNECTING 20X1/4 (TUBING) ×2 IMPLANT
TUBE SALEM SUMP 16 FR W/ARV (TUBING) ×1 IMPLANT
YANKAUER SUCT BULB TIP NO VENT (SUCTIONS) ×1 IMPLANT

## 2018-10-20 NOTE — Anesthesia Procedure Notes (Signed)
Procedure Name: Intubation Date/Time: 10/20/2018 8:12 AM Performed by: Eulas Post, Tobin Witucki W, CRNA Pre-anesthesia Checklist: Patient identified, Emergency Drugs available, Suction available and Patient being monitored Patient Re-evaluated:Patient Re-evaluated prior to induction Oxygen Delivery Method: Circle system utilized Preoxygenation: Pre-oxygenation with 100% oxygen Induction Type: IV induction Ventilation: Mask ventilation without difficulty Laryngoscope Size: Miller and 2 Grade View: Grade I Tube type: Oral Tube size: 7.0 mm Number of attempts: 1 Airway Equipment and Method: Stylet Placement Confirmation: ETT inserted through vocal cords under direct vision,  positive ETCO2 and breath sounds checked- equal and bilateral Secured at: 23 cm Tube secured with: Tape Dental Injury: Teeth and Oropharynx as per pre-operative assessment

## 2018-10-20 NOTE — Anesthesia Preprocedure Evaluation (Signed)
Anesthesia Evaluation  Patient identified by MRN, date of birth, ID band Patient awake    Reviewed: Allergy & Precautions, NPO status , Patient's Chart, lab work & pertinent test results  Airway Mallampati: II  TM Distance: >3 FB     Dental   Pulmonary    breath sounds clear to auscultation       Cardiovascular negative cardio ROS   Rhythm:Regular Rate:Normal     Neuro/Psych    GI/Hepatic Neg liver ROS, GERD  ,  Endo/Other  Hypothyroidism   Renal/GU negative Renal ROS     Musculoskeletal   Abdominal   Peds  Hematology   Anesthesia Other Findings   Reproductive/Obstetrics                             Anesthesia Physical Anesthesia Plan  ASA: II  Anesthesia Plan: General   Post-op Pain Management:    Induction: Intravenous  PONV Risk Score and Plan: 3 and Ondansetron, Dexamethasone and Midazolam  Airway Management Planned: Oral ETT  Additional Equipment:   Intra-op Plan:   Post-operative Plan: Extubation in OR  Informed Consent: I have reviewed the patients History and Physical, chart, labs and discussed the procedure including the risks, benefits and alternatives for the proposed anesthesia with the patient or authorized representative who has indicated his/her understanding and acceptance.     Dental advisory given  Plan Discussed with: CRNA and Anesthesiologist  Anesthesia Plan Comments:         Anesthesia Quick Evaluation

## 2018-10-20 NOTE — Anesthesia Postprocedure Evaluation (Signed)
Anesthesia Post Note  Patient: Jillian Scott  Procedure(s) Performed: MAXILLARY ANTROSTOMY WITH TISSUE REMOVAL (Right Nose)     Patient location during evaluation: PACU Anesthesia Type: General Level of consciousness: awake Pain management: pain level controlled Vital Signs Assessment: post-procedure vital signs reviewed and stable Respiratory status: spontaneous breathing Cardiovascular status: stable Postop Assessment: no apparent nausea or vomiting Anesthetic complications: no    Last Vitals:  Vitals:   10/20/18 1015 10/20/18 1055  BP: (!) 148/88 (!) 148/76  Pulse: 80 73  Resp: (!) 22 16  Temp:  37.1 C  SpO2: 100% 98%    Last Pain:  Vitals:   10/20/18 1055  TempSrc:   PainSc: 0-No pain                  [place]

## 2018-10-20 NOTE — H&P (Signed)
Cc: Right maxillary mass  HPI: The patient is an 83 year old female who presented today for evaluation of her right maxillary sinus mass. The patient is seen in consultation requested by [facility].  According to the patient, the patient has a history of a pulmonary nodule.  She recently underwent a PET scan evaluation.  The PET scan showed an intensely hypermetabolic right maxillary sinus mass.  It measured 2.3 cm in diameter. 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  The patient is currently asymptomatic.  She denies any facial pain, nasal congestion, nasal drainage or headache.  She is unaware of the maxillary sinus mass.  She has no previous history of sinonasal surgery.   The patient's review of systems (constitutional, eyes, ENT, cardiovascular, respiratory, GI, musculoskeletal, skin, neurologic, psychiatric, endocrine, hematologic, allergic) is noted in the ROS questionnaire.  It is reviewed with the patient.  Family health history: Diabetes.  Major events: Hysterectomy.  Ongoing medical problems: Reflux, thyroid disease.  Social history: The patient is single. She denies the use of tobacco, alcohol or illegal drugs.    Exam General: Communicates without difficulty, well nourished, no acute distress. Head: Normocephalic, no evidence injury, no tenderness, facial buttresses intact without stepoff. Face/sinus: No tenderness to palpation and percussion. Facial movement is normal and symmetric. Eyes: PERRL, EOMI. No scleral icterus, conjunctivae clear. Neuro: CN II exam reveals vision grossly intact.  No nystagmus at any point of gaze. Ears: Auricles well formed without lesions.  Ear canals are intact without mass or lesion.  No erythema or edema is appreciated.  The TMs are intact without fluid. Nose: External evaluation reveals normal support and skin without lesions.  Dorsum is intact.  Anterior rhinoscopy reveals mildly congested mucosa over anterior  aspect of inferior turbinates and intact septum.  No purulence noted. Oral:  Oral cavity and oropharynx are intact, symmetric, without erythema or edema.  Mucosa is moist without lesions. Neck: Full range of motion without pain.  There is no significant lymphadenopathy.  No masses palpable.  Thyroid bed within normal limits to palpation.  Parotid glands and submandibular glands equal bilaterally without mass.  Trachea is midline. Neuro:  CN 2-12 grossly intact.   Procedure:  Flexible Nasal Endoscopy: Risks, benefits, and alternatives of flexible endoscopy were explained to the patient.  Specific mention was made of the risk of throat numbness with difficulty swallowing, possible bleeding from the nose and mouth, and pain from the procedure.  The patient gave oral consent to proceed.  The nasal cavities were decongested and anesthetised with a combination of oxymetazoline and 4% lidocaine solution.  The flexible scope was inserted into the right nasal cavity.  Endoscopy of the inferior and middle meatus was performed.  The congested mucosa was as described above.  No polyp, mass, or lesion was appreciated.  Olfactory cleft was clear.  Nasopharynx was clear.  Turbinates were hypertrophied but without mass. The procedure was repeated on the contralateral side with similar findings.  The patient tolerated the procedure well.  Instructions were given to avoid eating or drinking for 2 hours.   Assessment 1.  Mild nasal mucosal congestion is noted on today's nasal endoscopy examination.  No polyps, mass, lesion is noted within the nasal cavity or the middle meatus.  2.  The patient has a 2.3 cm right maxillary sinus mass that is intensely hypermetabolic on her PET scan.  The findings are concerning for possible inverted papilloma or other maxillary neoplasm.   Plan 1.  The physical exam and nasal endoscopy findings are reviewed with the patient. The CT images are also reviewed.   2.  Based on the above findings,  the patient will need to undergo right maxillary antrostomy and removal of the maxillary sinus mass.  The risks, benefits, alternatives and details of the procedure are reviewed with the patient.  Questions are invited and answered.  3.  The patient would like to proceed with the procedure.

## 2018-10-20 NOTE — Discharge Instructions (Addendum)
POSTOPERATIVE INSTRUCTIONS FOR PATIENTS HAVING NASAL OR SINUS OPERATIONS ACTIVITY: Restrict activity at home for the first two days, resting as much as possible. Light activity is best. You may usually return to work within a week. You should refrain from nose blowing, strenuous activity, or heavy lifting greater than 20lbs for a total of one week after your operation.  If sneezing cannot be avoided, sneeze with your mouth open. DISCOMFORT: You may experience a dull headache and pressure along with nasal congestion and discharge. These symptoms may be worse during the first week after the operation but may last as long as two to four weeks.  Please take Tylenol or the pain medication that has been prescribed for you. Do not take aspirin or aspirin containing medications since they may cause bleeding.  You may experience symptoms of post nasal drainage, nasal congestion, headaches and fatigue for two or three months after your operation.  BLEEDING: You may have some blood tinged nasal drainage for approximately two weeks after the operation.  The discharge will be worse for the first week.  Please call our office at [phone] or go to the nearest hospital emergency room if you experience any of the following: heavy, bright red blood from your nose or mouth that lasts longer than 15 minutes or coughing up or vomiting bright red blood or blood clots. GENERAL CONSIDERATIONS: 1. A gauze dressing will be placed on your upper lip to absorb any drainage after the operation. You may need to change this several times a day.  If you do not have very much drainage, you may remove the dressing.  Remember that you may gently wipe your nose with a tissue and sniff in, but DO NOT blow your nose. 2. Please keep all of your postoperative appointments.  Your final results after the operation will depend on proper follow-up.  The initial visit is usually 2 to 5 days after the operation.  During this visit, the remaining nasal  packing and internal septal splints will be removed.  Your nasal and sinus cavities will be cleaned.  During the second visit, your nasal and sinus cavities will be cleaned again. Have someone drive you to your first two postoperative appointments.  3. How you care for your nose after the operation will influence the results that you obtain.  You should follow all directions, take your medication as prescribed, and call our office [phone] with any problems or questions. 4. You may be more comfortable sleeping with your head elevated on two pillows. 5. Do not take any medications that we have not prescribed or recommended. WARNING SIGNS: if any of the following should occur, please call our office: 1. Persistent fever greater than 102F. 2. Persistent vomiting. 3. Severe and constant pain that is not relieved by prescribed pain medication. 4. Trauma to the nose. 5. Rash or unusual side effects from any medicines.    Post Anesthesia Home Care Instructions  Activity: Get plenty of rest for the remainder of the day. A responsible individual must stay with you for 24 hours following the procedure.  For the next 24 hours, DO NOT: -Drive a car -Paediatric nurse -Drink alcoholic beverages -Take any medication unless instructed by your physician -Make any legal decisions or sign important papers.  Meals: Start with liquid foods such as gelatin or soup. Progress to regular foods as tolerated. Avoid greasy, spicy, heavy foods. If nausea and/or vomiting occur, drink only clear liquids until the nausea and/or vomiting subsides. Call your physician if  vomiting continues.  Special Instructions/Symptoms: Your throat may feel dry or sore from the anesthesia or the breathing tube placed in your throat during surgery. If this causes discomfort, gargle with warm salt water. The discomfort should disappear within 24 hours.  If you had a scopolamine patch placed behind your ear for the management of  post- operative nausea and/or vomiting:  1. The medication in the patch is effective for 72 hours, after which it should be removed.  Wrap patch in a tissue and discard in the trash. Wash hands thoroughly with soap and water. 2. You may remove the patch earlier than 72 hours if you experience unpleasant side effects which may include dry mouth, dizziness or visual disturbances. 3. Avoid touching the patch. Wash your hands with soap and water after contact with the patch.    Call your surgeon if you experience:   1.  Fever over 101.0. 2.  Inability to urinate. 3.  Nausea and/or vomiting. 4.  Extreme swelling or bruising at the surgical site. 5.  Continued bleeding from the incision. 6.  Increased pain, redness or drainage from the incision. 7.  Problems related to your pain medication. 8.  Any problems and/or concerns

## 2018-10-20 NOTE — Op Note (Signed)
DATE OF PROCEDURE: 10/20/2018  OPERATIVE REPORT   SURGEON: Leta Baptist, MD   PREOPERATIVE DIAGNOSES:  1. Right maxillary mass  POSTOPERATIVE DIAGNOSES:  1. Right maxillary mass. 2. Chronic right maxillary sinusitis.  PROCEDURE PERFORMED:  1. Endoscopic right maxillary antrostomy with tissue removal.  ANESTHESIA: General endotracheal tube anesthesia.   COMPLICATIONS: None.   ESTIMATED BLOOD LOSS: 50 mL.   INDICATION FOR PROCEDURE: Jillian Scott is a 83 y.o. female with a history of a pulmonary nodule.  She recently underwent a PET scan evaluation.  The PET scan showed an intensely hypermetabolic right maxillary sinus mass.  It measured 2.3 cm in diameter. 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 Based on the above findings, the decision was made for the patient to undergo the above-stated procedure. The risks, benefits, alternatives, and details of the procedures were discussed with the patient. Questions were invited and answered. Informed consent was obtained.   DESCRIPTION OF PROCEDURE: The patient was taken to the operating room and placed supine on the operating table. General endotracheal tube anesthesia was administered by the anesthesiologist. The patient was positioned, and prepped and draped in the standard fashion for nasal surgery. Pledgets soaked with Afrin were placed in both nasal cavities for decongestion. The pledgets were subsequently removed.  Using a 0 endoscope, the right nasal cavity was examined. The uncinate process was resected with a freer elevator. The maxillary antrum was entered and enlarged using a combination of backbiter and microdebrider. A 70 endoscope was used to examined the maxillary sinus.  A soft tissue mass was noted at the anterior lateral portion of the maxillary sinus. The mass was removed in a piecemeal fashion. Purulent debris was also noted with the mass. The maxillary sinus was copiously irrigated with  saline solution.  The care of the patient was turned over to the anesthesiologist. The patient was awakened from anesthesia without difficulty. The patient was extubated and transferred to the recovery room in good condition.   OPERATIVE FINDINGS: A right maxillary sinus mass with purulent debris.  SPECIMEN: Right maxillary sinus mass.  FOLLOWUP CARE: The patient be discharged home once he is awake and alert. The patient will be placed on Percocet 1 tablets p.o. q.4 hours p.r.n. pain, and amoxicillin 875 mg p.o. b.i.d. for 5 days. The patient will follow up in my office in approximately 1 week.  Beckham Capistran Raynelle Bring, MD

## 2018-10-20 NOTE — Transfer of Care (Signed)
Immediate Anesthesia Transfer of Care Note  Patient: Jillian Scott  Procedure(s) Performed: MAXILLARY ANTROSTOMY WITH TISSUE REMOVAL (Right Nose)  Patient Location: PACU  Anesthesia Type:General  Level of Consciousness: awake  Airway & Oxygen Therapy: Patient Spontanous Breathing and Patient connected to face mask oxygen  Post-op Assessment: Report given to RN and Post -op Vital signs reviewed and stable  Post vital signs: Reviewed and stable  Last Vitals:  Vitals Value Taken Time  BP 156/84 10/20/18 0917  Temp    Pulse 66 10/20/18 0919  Resp 17 10/20/18 0919  SpO2 100 % 10/20/18 0919  Vitals shown include unvalidated device data.  Last Pain:  Vitals:   10/20/18 0648  TempSrc: Oral  PainSc: 0-No pain         Complications: No apparent anesthesia complications

## 2018-10-21 LAB — ACID FAST SMEAR (AFB, MYCOBACTERIA): Acid Fast Smear: NEGATIVE

## 2018-10-23 ENCOUNTER — Encounter (HOSPITAL_BASED_OUTPATIENT_CLINIC_OR_DEPARTMENT_OTHER): Payer: Self-pay | Admitting: Otolaryngology

## 2018-10-23 LAB — SURGICAL PATHOLOGY

## 2018-10-25 LAB — AEROBIC/ANAEROBIC CULTURE W GRAM STAIN (SURGICAL/DEEP WOUND): Culture: NORMAL

## 2018-10-27 DIAGNOSIS — J320 Chronic maxillary sinusitis: Secondary | ICD-10-CM | POA: Diagnosis not present

## 2018-10-27 DIAGNOSIS — J338 Other polyp of sinus: Secondary | ICD-10-CM | POA: Diagnosis not present

## 2018-11-10 DIAGNOSIS — J338 Other polyp of sinus: Secondary | ICD-10-CM | POA: Diagnosis not present

## 2018-11-10 DIAGNOSIS — J320 Chronic maxillary sinusitis: Secondary | ICD-10-CM | POA: Diagnosis not present

## 2018-11-21 LAB — FUNGUS CULTURE WITH STAIN

## 2018-11-21 LAB — FUNGUS CULTURE RESULT

## 2018-11-21 LAB — FUNGAL ORGANISM REFLEX

## 2018-12-12 DIAGNOSIS — J338 Other polyp of sinus: Secondary | ICD-10-CM | POA: Diagnosis not present

## 2018-12-12 DIAGNOSIS — J320 Chronic maxillary sinusitis: Secondary | ICD-10-CM | POA: Diagnosis not present

## 2018-12-21 LAB — ACID FAST CULTURE WITH REFLEXED SENSITIVITIES (MYCOBACTERIA): Acid Fast Culture: NEGATIVE

## 2019-02-23 ENCOUNTER — Other Ambulatory Visit: Payer: Self-pay | Admitting: *Deleted

## 2019-02-23 DIAGNOSIS — R911 Solitary pulmonary nodule: Secondary | ICD-10-CM

## 2019-02-26 ENCOUNTER — Inpatient Hospital Stay: Admission: RE | Admit: 2019-02-26 | Payer: Medicare Other | Source: Ambulatory Visit

## 2019-03-06 ENCOUNTER — Other Ambulatory Visit (INDEPENDENT_AMBULATORY_CARE_PROVIDER_SITE_OTHER): Payer: Medicare Other

## 2019-03-06 DIAGNOSIS — R911 Solitary pulmonary nodule: Secondary | ICD-10-CM | POA: Diagnosis not present

## 2019-03-06 LAB — BASIC METABOLIC PANEL WITH GFR
BUN: 11 mg/dL (ref 6–23)
CO2: 31 meq/L (ref 19–32)
Calcium: 10.2 mg/dL (ref 8.4–10.5)
Chloride: 98 meq/L (ref 96–112)
Creatinine, Ser: 0.81 mg/dL (ref 0.40–1.20)
GFR: 82.01 mL/min
Glucose, Bld: 100 mg/dL — ABNORMAL HIGH (ref 70–99)
Potassium: 4.8 meq/L (ref 3.5–5.1)
Sodium: 135 meq/L (ref 135–145)

## 2019-03-08 ENCOUNTER — Other Ambulatory Visit: Payer: Self-pay

## 2019-03-08 ENCOUNTER — Inpatient Hospital Stay: Admission: RE | Admit: 2019-03-08 | Payer: Medicare Other | Source: Ambulatory Visit

## 2019-03-08 NOTE — Patient Outreach (Signed)
[facility]) Care Management  03/08/2019  Jillian Scott 1935/10/24 [id number]   Medication Adherence call to Jillian Scott Telephone call to Patient regarding Medication Adherence unable to reach patient. Jillian Scott is showing past due on Rosuvastatin 10 mg under [place].   [facility] Management Direct Dial [phone]  Fax [phone] [email or web address]

## 2019-03-09 ENCOUNTER — Inpatient Hospital Stay: Admission: RE | Admit: 2019-03-09 | Payer: Medicare Other | Source: Ambulatory Visit

## 2019-03-13 ENCOUNTER — Ambulatory Visit: Payer: Medicare Other | Admitting: Pulmonary Disease

## 2019-03-22 ENCOUNTER — Other Ambulatory Visit: Payer: Self-pay

## 2019-03-22 ENCOUNTER — Ambulatory Visit (INDEPENDENT_AMBULATORY_CARE_PROVIDER_SITE_OTHER)
Admission: RE | Admit: 2019-03-22 | Discharge: 2019-03-22 | Disposition: A | Discharge status: Home / Self Care | Payer: Medicare Other | Source: Ambulatory Visit | Attending: Primary Care | Admitting: Primary Care

## 2019-03-22 DIAGNOSIS — R911 Solitary pulmonary nodule: Secondary | ICD-10-CM

## 2019-03-22 MED ORDER — IOHEXOL 300 MG/ML  SOLN
80.0000 mL | Freq: Once | INTRAMUSCULAR | Status: AC | PRN
  Administered 2019-03-22: 80 mL via INTRAVENOUS

## 2019-03-28 NOTE — Progress Notes (Signed)
Please let patient know CT showed Stable spiculated right upper lobe nodule. Small pericardial effusion stable from the prior exam and Aortic Atherosclerosis  Follow CT chest in 6 months (please order)

## 2019-03-30 ENCOUNTER — Other Ambulatory Visit: Payer: Self-pay

## 2019-03-30 DIAGNOSIS — R911 Solitary pulmonary nodule: Secondary | ICD-10-CM

## 2019-04-17 DIAGNOSIS — J338 Other polyp of sinus: Secondary | ICD-10-CM | POA: Diagnosis not present

## 2019-04-17 DIAGNOSIS — J320 Chronic maxillary sinusitis: Secondary | ICD-10-CM | POA: Diagnosis not present

## 2019-04-17 DIAGNOSIS — J343 Hypertrophy of nasal turbinates: Secondary | ICD-10-CM | POA: Diagnosis not present

## 2019-08-13 DIAGNOSIS — E039 Hypothyroidism, unspecified: Secondary | ICD-10-CM | POA: Diagnosis not present

## 2019-08-13 DIAGNOSIS — E7800 Pure hypercholesterolemia, unspecified: Secondary | ICD-10-CM | POA: Diagnosis not present

## 2019-08-13 DIAGNOSIS — Z0000 Encounter for general adult medical examination without abnormal findings: Secondary | ICD-10-CM | POA: Diagnosis not present

## 2019-08-13 DIAGNOSIS — E785 Hyperlipidemia, unspecified: Secondary | ICD-10-CM | POA: Diagnosis not present

## 2019-08-20 DIAGNOSIS — E785 Hyperlipidemia, unspecified: Secondary | ICD-10-CM | POA: Diagnosis not present

## 2019-08-20 DIAGNOSIS — Z0000 Encounter for general adult medical examination without abnormal findings: Secondary | ICD-10-CM | POA: Diagnosis not present

## 2019-08-20 DIAGNOSIS — E538 Deficiency of other specified B group vitamins: Secondary | ICD-10-CM | POA: Diagnosis not present

## 2019-08-20 DIAGNOSIS — E039 Hypothyroidism, unspecified: Secondary | ICD-10-CM | POA: Diagnosis not present

## 2019-10-04 ENCOUNTER — Ambulatory Visit (INDEPENDENT_AMBULATORY_CARE_PROVIDER_SITE_OTHER)
Admission: RE | Admit: 2019-10-04 | Discharge: 2019-10-04 | Disposition: A | Discharge status: Home / Self Care | Payer: Medicare Other | Source: Ambulatory Visit | Attending: Primary Care | Admitting: Primary Care

## 2019-10-04 ENCOUNTER — Other Ambulatory Visit: Payer: Self-pay

## 2019-10-04 DIAGNOSIS — R911 Solitary pulmonary nodule: Secondary | ICD-10-CM | POA: Diagnosis not present

## 2019-10-04 DIAGNOSIS — I2510 Atherosclerotic heart disease of native coronary artery without angina pectoris: Secondary | ICD-10-CM | POA: Diagnosis not present

## 2019-10-04 DIAGNOSIS — I700 Atherosclerosis of aorta: Secondary | ICD-10-CM | POA: Diagnosis not present

## 2019-10-04 DIAGNOSIS — J984 Other disorders of lung: Secondary | ICD-10-CM | POA: Diagnosis not present

## 2019-10-04 DIAGNOSIS — I313 Pericardial effusion (noninflammatory): Secondary | ICD-10-CM | POA: Diagnosis not present

## 2019-10-05 ENCOUNTER — Telehealth: Payer: Self-pay | Admitting: Primary Care

## 2019-10-05 NOTE — Telephone Encounter (Signed)
Last seen 08/2018. Needs follow-up appointment with me preferably or BW to discuss CT results within the next 4 weeks

## 2019-10-05 NOTE — Telephone Encounter (Signed)
Spoke with patient regarding CT result's . Pert Dr.Alva I scheduled patient for a f/u top discuss CT result's . Made a appt for 11/01/19 at 11:00. Patient's voice was understanding. Nothing else further needed.

## 2019-10-05 NOTE — Telephone Encounter (Signed)
Spoke with Jillian Scott from [facility] regarding patein's CT Chest scan.  Dr.Alva can you please advise.  Thank you   CT Chest Wo Contrast (Accession [id number]) (Order [id number]) Imaging Date: 10/04/2019 Department: [facility] IMAGING [place] Released By: Thayer Headings Authorizing: Martyn Ehrich, NP  Exam Status  Status  Final [99]  PACS Intelerad Image Link  Show images for CT Chest Wo Contrast Study Result  Narrative & Impression  CLINICAL DATA:  RIGHT upper lobe pulmonary nodule, followup  EXAM: CT CHEST WITHOUT CONTRAST  TECHNIQUE: Multidetector CT imaging of the chest was performed following the standard protocol without IV contrast. Sagittal and coronal MPR images reconstructed from axial data set.  COMPARISON:  03/22/2019  FINDINGS: Cardiovascular: Atherosclerotic calcifications of aorta, proximal great vessels and coronary arteries. Slight enlargement of cardiac chambers. Small pericardial effusion. Aorta normal caliber.  Mediastinum/Nodes: Esophagus unremarkable. Base of cervical region normal appearance. No thoracic adenopathy. Few normal sized axillary lymph nodes bilaterally. Focal fluid collection at RIGHT cardiophrenic angle 4.6 x 2.9 x 2.9 cm likely pericardial cyst unchanged.  Lungs/Pleura: 13 x 9 x 17 mm spiculated nodular density in the RIGHT upper lobe, previously 13 x 8 x 16 mm. This measured 12 x 8 x 12 mm on 01/09/2018. Minimal biapical scarring. No acute infiltrate, pleural effusion or pneumothorax. No additional mass/nodule.  Upper Abdomen: Cyst at superior posterior RIGHT lobe liver 16 x 13 mm image 124. Remaining visualized upper abdomen unremarkable.  Musculoskeletal: No acute osseous findings.  IMPRESSION: Slight interval increase in size of a spiculated RIGHT upper lobe pulmonary nodule 13 x 9 x 17 mm, previously 13 x 8 x 12 mm on 01/09/2018.  Despite the low level of FDG accumulation on PET-CT,  due to characteristics and increase in size since 2019 the finding remains concerning for a pulmonary malignancy.  Consider tissue diagnosis.  Small pericardial effusion and stable pericardial cyst.  Aortic Atherosclerosis (ICD10-I70.0).  Insert PRA call report   Electronically Signed   By: Lavonia Dana M.D.   On: 10/05/2019 12:20

## 2019-10-16 DIAGNOSIS — J320 Chronic maxillary sinusitis: Secondary | ICD-10-CM | POA: Diagnosis not present

## 2019-10-16 DIAGNOSIS — J343 Hypertrophy of nasal turbinates: Secondary | ICD-10-CM | POA: Diagnosis not present

## 2019-10-16 DIAGNOSIS — J338 Other polyp of sinus: Secondary | ICD-10-CM | POA: Diagnosis not present

## 2019-11-01 ENCOUNTER — Ambulatory Visit: Payer: Medicare Other | Admitting: Pulmonary Disease

## 2019-11-22 ENCOUNTER — Encounter: Payer: Self-pay | Admitting: Pulmonary Disease

## 2019-11-22 ENCOUNTER — Other Ambulatory Visit: Payer: Self-pay

## 2019-11-22 ENCOUNTER — Ambulatory Visit: Payer: Medicare Other | Admitting: Pulmonary Disease

## 2019-11-22 DIAGNOSIS — R911 Solitary pulmonary nodule: Secondary | ICD-10-CM | POA: Diagnosis not present

## 2019-11-22 NOTE — Patient Instructions (Signed)
Nodule in your right upper lung has increased slightly in size. Schedule PET scan in January 2022 and follow-up visit after If this lights up on the PET scan, then we will pursue biopsy

## 2019-11-22 NOTE — Progress Notes (Signed)
   Subjective:    Patient ID: Jillian Scott, female    DOB: February 20, 1935, 84 y.o.   MRN: [id number]  HPI  84 yo never smoker for FU  of pulmonary nodule incidentally found when she had a fall and underwent CT chest and abdomen in 12/2017 PET scan 08/2018 showed mild hypermetabolism, SUV 2.3.  She underwent repeat CT scan 09/2019 which we reviewed today. She denies cough or wheezing or hemoptysis. She reports mild dyspnea  Significant tests/ events reviewed  09/2019 CT chest WO con >> 13 x 9 x 17 mm spiculated nodular density in the RIGHT upper lobe, previously 13 x 8 x 16 mm. This measured 12 x 8 x 12 mm on 01/09/2018.   03/2019 CT chest Stable spiculated right upper lobe nodule 12.8 mm  12/2017 CT chest/abdomen/pelvis with contrast - 4 cm  benign pericardial cyst.  It also showed a 14 mm spiculated nodule in the right upper lobe  Review of Systems Patient denies significant dyspnea,cough, hemoptysis,  chest pain, palpitations, pedal edema, orthopnea, paroxysmal nocturnal dyspnea, lightheadedness, nausea, vomiting, abdominal or  leg pains      Objective:   Physical Exam  Gen. Pleasant, thin, elderly, in no distress ENT - no thrush, no pallor/icterus,no post nasal drip Neck: No JVD, no thyromegaly, no carotid bruits Lungs: no use of accessory muscles, no dullness to percussion, clear without rales or rhonchi  Cardiovascular: Rhythm regular, heart sounds  normal, no murmurs or gallops, no peripheral edema Musculoskeletal: No deformities, no cyanosis or clubbing        Assessment & Plan:

## 2019-11-22 NOTE — Assessment & Plan Note (Signed)
Although she is a never smoker, slight increase in size of nodule from 12 to 17 mm is concerning.  Previous PET scan has shown mild hypermetabolism.  Nodule in  right upper lung has increased slightly in size. Schedule PET scan in January 2022 and follow-up visit after I briefly discussed bronchoscopic biopsy and she would be willing to proceed if PET scan shows hypermetabolic lymph

## 2020-01-23 ENCOUNTER — Ambulatory Visit (HOSPITAL_COMMUNITY)
Admission: RE | Admit: 2020-01-23 | Discharge: 2020-01-23 | Disposition: A | Discharge status: Home / Self Care | Payer: Medicare Other | Source: Ambulatory Visit | Attending: Pulmonary Disease | Admitting: Pulmonary Disease

## 2020-01-23 ENCOUNTER — Other Ambulatory Visit: Payer: Self-pay

## 2020-01-23 DIAGNOSIS — J320 Chronic maxillary sinusitis: Secondary | ICD-10-CM | POA: Diagnosis not present

## 2020-01-23 DIAGNOSIS — I700 Atherosclerosis of aorta: Secondary | ICD-10-CM | POA: Insufficient documentation

## 2020-01-23 DIAGNOSIS — K5730 Diverticulosis of large intestine without perforation or abscess without bleeding: Secondary | ICD-10-CM | POA: Diagnosis not present

## 2020-01-23 DIAGNOSIS — R911 Solitary pulmonary nodule: Secondary | ICD-10-CM | POA: Diagnosis not present

## 2020-01-23 DIAGNOSIS — J3489 Other specified disorders of nose and nasal sinuses: Secondary | ICD-10-CM | POA: Diagnosis not present

## 2020-01-23 DIAGNOSIS — I2510 Atherosclerotic heart disease of native coronary artery without angina pectoris: Secondary | ICD-10-CM | POA: Insufficient documentation

## 2020-01-23 LAB — GLUCOSE, CAPILLARY: Glucose-Capillary: 89 mg/dL (ref 70–99)

## 2020-01-23 MED ORDER — FLUDEOXYGLUCOSE F - 18 (FDG) INJECTION
5.5000 | Freq: Once | INTRAVENOUS | Status: AC | PRN
  Administered 2020-01-23: 5.5 via INTRAVENOUS

## 2020-02-06 NOTE — Progress Notes (Signed)
Called and spoke with patient and scheduled office visit to go over results per Dr [name] with Dr [name] for Thursday, 02/21/2020 at 12pm at the [facility] office. Patient agreeable to time, date and location. Nothing further needed at this time. Will route to Dr [name] as Juluis Rainier.

## 2020-02-14 DIAGNOSIS — E039 Hypothyroidism, unspecified: Secondary | ICD-10-CM | POA: Diagnosis not present

## 2020-02-14 DIAGNOSIS — Z79899 Other long term (current) drug therapy: Secondary | ICD-10-CM | POA: Diagnosis not present

## 2020-02-14 DIAGNOSIS — N390 Urinary tract infection, site not specified: Secondary | ICD-10-CM | POA: Diagnosis not present

## 2020-02-14 DIAGNOSIS — E538 Deficiency of other specified B group vitamins: Secondary | ICD-10-CM | POA: Diagnosis not present

## 2020-02-14 DIAGNOSIS — E785 Hyperlipidemia, unspecified: Secondary | ICD-10-CM | POA: Diagnosis not present

## 2020-02-14 DIAGNOSIS — E7800 Pure hypercholesterolemia, unspecified: Secondary | ICD-10-CM | POA: Diagnosis not present

## 2020-02-14 DIAGNOSIS — M8580 Other specified disorders of bone density and structure, unspecified site: Secondary | ICD-10-CM | POA: Diagnosis not present

## 2020-02-14 DIAGNOSIS — E559 Vitamin D deficiency, unspecified: Secondary | ICD-10-CM | POA: Diagnosis not present

## 2020-02-21 ENCOUNTER — Encounter: Payer: Self-pay | Admitting: Pulmonary Disease

## 2020-02-21 ENCOUNTER — Other Ambulatory Visit: Payer: Self-pay

## 2020-02-21 ENCOUNTER — Ambulatory Visit: Payer: Medicare Other | Admitting: Pulmonary Disease

## 2020-02-21 ENCOUNTER — Telehealth: Payer: Self-pay | Admitting: Pulmonary Disease

## 2020-02-21 DIAGNOSIS — J3489 Other specified disorders of nose and nasal sinuses: Secondary | ICD-10-CM | POA: Diagnosis not present

## 2020-02-21 DIAGNOSIS — E039 Hypothyroidism, unspecified: Secondary | ICD-10-CM | POA: Diagnosis not present

## 2020-02-21 DIAGNOSIS — M8580 Other specified disorders of bone density and structure, unspecified site: Secondary | ICD-10-CM | POA: Diagnosis not present

## 2020-02-21 DIAGNOSIS — R911 Solitary pulmonary nodule: Secondary | ICD-10-CM

## 2020-02-21 DIAGNOSIS — I700 Atherosclerosis of aorta: Secondary | ICD-10-CM | POA: Diagnosis not present

## 2020-02-21 DIAGNOSIS — E538 Deficiency of other specified B group vitamins: Secondary | ICD-10-CM | POA: Diagnosis not present

## 2020-02-21 DIAGNOSIS — Z79899 Other long term (current) drug therapy: Secondary | ICD-10-CM | POA: Diagnosis not present

## 2020-02-21 DIAGNOSIS — E785 Hyperlipidemia, unspecified: Secondary | ICD-10-CM | POA: Diagnosis not present

## 2020-02-21 DIAGNOSIS — E559 Vitamin D deficiency, unspecified: Secondary | ICD-10-CM | POA: Diagnosis not present

## 2020-02-21 DIAGNOSIS — R634 Abnormal weight loss: Secondary | ICD-10-CM | POA: Diagnosis not present

## 2020-02-21 NOTE — Assessment & Plan Note (Signed)
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  She does have some recurrent hypermetabolism in this area on PET scan.  She has an ENT follow-up pending in March

## 2020-02-21 NOTE — Assessment & Plan Note (Signed)
Gradual increase in size over last 3 years with low-grade hypermetabolism concerning for low-grade malignancy.  We discussed options today. She is not a good candidate for surgery being thin and frail and she is worried about morbidity after surgery.  I think she would be a good candidate for SBRT.  I explained to her that she would require diagnostic procedure with navigation bronchoscopy prior to this.  She was agreeable to proceed. I discussed risks of the procedure including that of lung puncture requiring a chest tube and that of general anesthesia. Some hypermetabolism noted in the right hilum but no lymph node correlate on noncontrast CT scan

## 2020-02-21 NOTE — Patient Instructions (Signed)
  We will set you up for biopsy of nodule in right upper lung and based on results, think about radiation. We discussed other option of surgery  Please get back with Dr. Benjamine Mola about the mass in your maxillary sinus

## 2020-02-21 NOTE — Progress Notes (Signed)
   Subjective:    Patient ID: Jillian Scott, female    DOB: 1935-02-28, 85 y.o.   MRN: [id number]  HPI  85 yo never smoker for FU  of pulmonary nodule incidentally found when she had a fall and underwent CT chest and abdomen in 12/2017 PET scan 08/2018 showed mild hypermetabolism, SUV 2.3.  Follow-up scans have shown slight increase in growth.  She reports mild shortness of breath while going up and down the stairs.  No weight loss or fevers.  Accompanied by her husband today.  We reviewed PET scan from January  Significant tests/ events reviewed  PET 01/2020 >> Persistent increased metabolic activity in the spiculated pulmonary nodule SUV 2.2  , Rt hilum ,RIGHT maxillary sinus. 09/2019 CT chest WO con >> 13 x 9 x 17 mm spiculated nodular density in the RIGHT upper lobe, previously 13 x 8 x 16 mm. This measured 12 x 8 x 12 mm on 01/09/2018.   03/2019 CT chest Stable spiculated right upper lobe nodule 12.8 mm  12/2017 CT chest/abdomen/pelvis with contrast - 4 cm  benign pericardial cyst. It also showed a 14 mm spiculated nodule in the right upper lobe  Review of Systems neg for any significant sore throat, dysphagia, itching, sneezing, nasal congestion or excess/ purulent secretions, fever, chills, sweats, unintended wt loss, pleuritic or exertional cp, hempoptysis, orthopnea pnd or change in chronic leg swelling. Also denies presyncope, palpitations, heartburn, abdominal pain, nausea, vomiting, diarrhea or change in bowel or urinary habits, dysuria,hematuria, rash, arthralgias, visual complaints, headache, numbness weakness or ataxia.     Objective:   Physical Exam  Gen. Pleasant, thin, in no distress ENT - no thrush, no pallor/icterus,no post nasal drip Neck: No JVD, no thyromegaly, no carotid bruits Lungs: no use of accessory muscles, no dullness to percussion, clear without rales or rhonchi  Cardiovascular: Rhythm regular, heart sounds  normal, no murmurs or gallops, no  peripheral edema Musculoskeletal: No deformities, no cyanosis or clubbing        Assessment & Plan:

## 2020-02-21 NOTE — H&P (View-Only) (Signed)
   Subjective:    Patient ID: Jillian Scott, female    DOB: 1935/08/16, 85 y.o.   MRN: [id number]  HPI  85 yo never smoker for FU  of pulmonary nodule incidentally found when she had a fall and underwent CT chest and abdomen in 12/2017 PET scan 08/2018 showed mild hypermetabolism, SUV 2.3.  Follow-up scans have shown slight increase in growth.  She reports mild shortness of breath while going up and down the stairs.  No weight loss or fevers.  Accompanied by her husband today.  We reviewed PET scan from January  Significant tests/ events reviewed  PET 01/2020 >> Persistent increased metabolic activity in the spiculated pulmonary nodule SUV 2.2  , Rt hilum ,RIGHT maxillary sinus. 09/2019 CT chest WO con >> 13 x 9 x 17 mm spiculated nodular density in the RIGHT upper lobe, previously 13 x 8 x 16 mm. This measured 12 x 8 x 12 mm on 01/09/2018.   03/2019 CT chest Stable spiculated right upper lobe nodule 12.8 mm  12/2017 CT chest/abdomen/pelvis with contrast - 4 cm  benign pericardial cyst. It also showed a 14 mm spiculated nodule in the right upper lobe  Review of Systems neg for any significant sore throat, dysphagia, itching, sneezing, nasal congestion or excess/ purulent secretions, fever, chills, sweats, unintended wt loss, pleuritic or exertional cp, hempoptysis, orthopnea pnd or change in chronic leg swelling. Also denies presyncope, palpitations, heartburn, abdominal pain, nausea, vomiting, diarrhea or change in bowel or urinary habits, dysuria,hematuria, rash, arthralgias, visual complaints, headache, numbness weakness or ataxia.     Objective:   Physical Exam  Gen. Pleasant, thin, in no distress ENT - no thrush, no pallor/icterus,no post nasal drip Neck: No JVD, no thyromegaly, no carotid bruits Lungs: no use of accessory muscles, no dullness to percussion, clear without rales or rhonchi  Cardiovascular: Rhythm regular, heart sounds  normal, no murmurs or gallops, no  peripheral edema Musculoskeletal: No deformities, no cyanosis or clubbing        Assessment & Plan:

## 2020-02-21 NOTE — Telephone Encounter (Signed)
PCCM:   I called and spoke with Mrs. Jillian Scott via phone this evening to discuss next steps regarding navigational bronchoscopy and tissue sampling.  Patient was seen earlier today by Dr. [name] in the clinic.  Patient is agreeable to proceed and is available to do the case on 03/11/2020.  Please see orders for navigational bronchoscopy plus video bronchoscopy the endobronchial ultrasound.  A super D CT of the chest has been ordered which will need to be completed prior to the navigational bronchoscopy case.  [facility] 02/21/2020 6:01 PM

## 2020-02-25 NOTE — Telephone Encounter (Signed)
EBUS/ENB was already scheduled.  I scheduled covid test and CT.  Spoke to pt & her son and went over appt info.

## 2020-02-28 ENCOUNTER — Other Ambulatory Visit: Payer: Self-pay

## 2020-02-28 ENCOUNTER — Ambulatory Visit (INDEPENDENT_AMBULATORY_CARE_PROVIDER_SITE_OTHER)
Admission: RE | Admit: 2020-02-28 | Discharge: 2020-02-28 | Disposition: A | Discharge status: Home / Self Care | Payer: Medicare Other | Source: Ambulatory Visit | Attending: Pulmonary Disease | Admitting: Pulmonary Disease

## 2020-02-28 DIAGNOSIS — I700 Atherosclerosis of aorta: Secondary | ICD-10-CM | POA: Diagnosis not present

## 2020-02-28 DIAGNOSIS — R911 Solitary pulmonary nodule: Secondary | ICD-10-CM | POA: Diagnosis not present

## 2020-02-28 DIAGNOSIS — I318 Other specified diseases of pericardium: Secondary | ICD-10-CM | POA: Diagnosis not present

## 2020-02-28 DIAGNOSIS — I2510 Atherosclerotic heart disease of native coronary artery without angina pectoris: Secondary | ICD-10-CM | POA: Diagnosis not present

## 2020-03-07 ENCOUNTER — Other Ambulatory Visit: Payer: Self-pay

## 2020-03-07 ENCOUNTER — Encounter (HOSPITAL_COMMUNITY): Payer: Self-pay | Admitting: Pulmonary Disease

## 2020-03-07 NOTE — Progress Notes (Signed)
PCP - Dr [name] Cardiologist - n/a Pulmonology - Dr Kara Mead  Chest x-ray - CT Chest 02/28/20 EKG - 01/10/18 Stress Test - 04/22/15 ECHO - 05/02/15 Cardiac Cath - n/a  STOP now taking any Aspirin (unless otherwise instructed by your surgeon), Aleve, Naproxen, Ibuprofen, Motrin, Advil, Goody's, BC's, all herbal medications, fish oil, and all vitamins.   Coronavirus Screening Covid test is scheduled on Sat., 03/08/20 Do you have any of the following symptoms:  Cough yes/no: No Fever (>100.32F)  yes/no: No Runny nose yes/no: No Sore throat yes/no: No Difficulty breathing/shortness of breath  Yes  Have you traveled in the last 14 days and where? yes/no: No  Patient verbalized understanding of instructions that were given via phone.

## 2020-03-08 ENCOUNTER — Other Ambulatory Visit (HOSPITAL_COMMUNITY)
Admission: RE | Admit: 2020-03-08 | Discharge: 2020-03-08 | Disposition: A | Discharge status: Home / Self Care | Payer: Medicare Other | Source: Ambulatory Visit | Attending: Pulmonary Disease | Admitting: Pulmonary Disease

## 2020-03-08 DIAGNOSIS — Z01812 Encounter for preprocedural laboratory examination: Secondary | ICD-10-CM | POA: Diagnosis not present

## 2020-03-08 DIAGNOSIS — Z20822 Contact with and (suspected) exposure to covid-19: Secondary | ICD-10-CM | POA: Insufficient documentation

## 2020-03-08 LAB — SARS CORONAVIRUS 2 (TAT 6-24 HRS): SARS Coronavirus 2: NEGATIVE

## 2020-03-11 ENCOUNTER — Encounter (HOSPITAL_COMMUNITY): Payer: Self-pay | Admitting: Pulmonary Disease

## 2020-03-11 ENCOUNTER — Ambulatory Visit (HOSPITAL_COMMUNITY)
Admission: RE | Admit: 2020-03-11 | Discharge: 2020-03-11 | Disposition: A | Discharge status: Home / Self Care | Payer: Medicare Other | Attending: Pulmonary Disease | Admitting: Pulmonary Disease

## 2020-03-11 ENCOUNTER — Other Ambulatory Visit: Payer: Self-pay

## 2020-03-11 ENCOUNTER — Ambulatory Visit (HOSPITAL_COMMUNITY): Payer: Medicare Other

## 2020-03-11 ENCOUNTER — Ambulatory Visit (HOSPITAL_COMMUNITY): Payer: Medicare Other | Admitting: Anesthesiology

## 2020-03-11 ENCOUNTER — Encounter (HOSPITAL_COMMUNITY)
Admission: RE | Disposition: A | Discharge status: Home / Self Care | Payer: Self-pay | Source: Home / Self Care | Attending: Pulmonary Disease

## 2020-03-11 DIAGNOSIS — C3490 Malignant neoplasm of unspecified part of unspecified bronchus or lung: Secondary | ICD-10-CM | POA: Insufficient documentation

## 2020-03-11 DIAGNOSIS — R846 Abnormal cytological findings in specimens from respiratory organs and thorax: Secondary | ICD-10-CM | POA: Diagnosis not present

## 2020-03-11 DIAGNOSIS — E785 Hyperlipidemia, unspecified: Secondary | ICD-10-CM | POA: Diagnosis not present

## 2020-03-11 DIAGNOSIS — K449 Diaphragmatic hernia without obstruction or gangrene: Secondary | ICD-10-CM | POA: Diagnosis not present

## 2020-03-11 DIAGNOSIS — Z9889 Other specified postprocedural states: Secondary | ICD-10-CM

## 2020-03-11 DIAGNOSIS — R911 Solitary pulmonary nodule: Secondary | ICD-10-CM | POA: Diagnosis not present

## 2020-03-11 DIAGNOSIS — C3411 Malignant neoplasm of upper lobe, right bronchus or lung: Secondary | ICD-10-CM | POA: Diagnosis not present

## 2020-03-11 DIAGNOSIS — E039 Hypothyroidism, unspecified: Secondary | ICD-10-CM | POA: Diagnosis not present

## 2020-03-11 HISTORY — DX: Dyspnea, unspecified: R06.00

## 2020-03-11 HISTORY — PX: BRONCHIAL WASHINGS: SHX5105

## 2020-03-11 HISTORY — PX: VIDEO BRONCHOSCOPY WITH ENDOBRONCHIAL ULTRASOUND: SHX6177

## 2020-03-11 HISTORY — PX: VIDEO BRONCHOSCOPY WITH ENDOBRONCHIAL NAVIGATION: SHX6175

## 2020-03-11 HISTORY — PX: BRONCHIAL NEEDLE ASPIRATION BIOPSY: SHX5106

## 2020-03-11 HISTORY — PX: BRONCHIAL BIOPSY: SHX5109

## 2020-03-11 HISTORY — PX: BRONCHIAL BRUSHINGS: SHX5108

## 2020-03-11 HISTORY — PX: FIDUCIAL MARKER PLACEMENT: SHX6858

## 2020-03-11 SURGERY — VIDEO BRONCHOSCOPY WITH ENDOBRONCHIAL NAVIGATION
Anesthesia: General | Laterality: Right

## 2020-03-11 MED ORDER — ONDANSETRON HCL 4 MG/2ML IJ SOLN
INTRAMUSCULAR | Status: DC | PRN
  Administered 2020-03-11: 4 mg via INTRAVENOUS

## 2020-03-11 MED ORDER — PHENYLEPHRINE HCL-NACL 10-0.9 MG/250ML-% IV SOLN
INTRAVENOUS | Status: DC | PRN
  Administered 2020-03-11: 30 ug/min via INTRAVENOUS

## 2020-03-11 MED ORDER — SUGAMMADEX SODIUM 200 MG/2ML IV SOLN
INTRAVENOUS | Status: DC | PRN
  Administered 2020-03-11: 200 mg via INTRAVENOUS

## 2020-03-11 MED ORDER — CHLORHEXIDINE GLUCONATE 0.12 % MT SOLN
OROMUCOSAL | Status: AC
  Administered 2020-03-11: 15 mL via OROMUCOSAL
  Filled 2020-03-11: qty 15

## 2020-03-11 MED ORDER — ROCURONIUM BROMIDE 10 MG/ML (PF) SYRINGE
PREFILLED_SYRINGE | INTRAVENOUS | Status: DC | PRN
  Administered 2020-03-11: 40 mg via INTRAVENOUS

## 2020-03-11 MED ORDER — CHLORHEXIDINE GLUCONATE 0.12 % MT SOLN
15.0000 mL | Freq: Once | OROMUCOSAL | Status: AC
  Filled 2020-03-11: qty 15

## 2020-03-11 MED ORDER — LACTATED RINGERS IV SOLN: INTRAVENOUS | Status: DC

## 2020-03-11 MED ORDER — LIDOCAINE 2% (20 MG/ML) 5 ML SYRINGE
INTRAMUSCULAR | Status: DC | PRN
  Administered 2020-03-11: 40 mg via INTRAVENOUS

## 2020-03-11 MED ORDER — PHENYLEPHRINE 40 MCG/ML (10ML) SYRINGE FOR IV PUSH (FOR BLOOD PRESSURE SUPPORT)
PREFILLED_SYRINGE | INTRAVENOUS | Status: DC | PRN
  Administered 2020-03-11: 80 ug via INTRAVENOUS

## 2020-03-11 MED ORDER — PROPOFOL 10 MG/ML IV BOLUS
INTRAVENOUS | Status: DC | PRN
  Administered 2020-03-11: 120 mg via INTRAVENOUS

## 2020-03-11 MED ORDER — FENTANYL CITRATE (PF) 250 MCG/5ML IJ SOLN
INTRAMUSCULAR | Status: DC | PRN
  Administered 2020-03-11: 50 ug via INTRAVENOUS

## 2020-03-11 SURGICAL SUPPLY — 54 items
ADAPTER BRONCH F/PENTAX (ADAPTER) ×3 IMPLANT
ADAPTER VALVE BIOPSY EBUS (MISCELLANEOUS) IMPLANT
ADPR BSCP EDG PNTX (ADAPTER) ×2
ADPTR VALVE BIOPSY EBUS (MISCELLANEOUS)
BRUSH CYTOL CELLEBRITY 1.5X140 (MISCELLANEOUS) ×3 IMPLANT
BRUSH SUPERTRAX BIOPSY (INSTRUMENTS) IMPLANT
BRUSH SUPERTRAX NDL-TIP CYTO (INSTRUMENTS) ×3 IMPLANT
CANISTER SUCT 3000ML PPV (MISCELLANEOUS) ×3 IMPLANT
CHANNEL WORK EXTEND EDGE 180 (KITS) IMPLANT
CHANNEL WORK EXTEND EDGE 45 (KITS) IMPLANT
CHANNEL WORK EXTEND EDGE 90 (KITS) IMPLANT
CONT SPEC 4OZ CLIKSEAL STRL BL (MISCELLANEOUS) ×3 IMPLANT
COVER BACK TABLE 60X90IN (DRAPES) ×3 IMPLANT
COVER DOME SNAP 22 D (MISCELLANEOUS) ×3 IMPLANT
FILTER STRAW FLUID ASPIR (MISCELLANEOUS) IMPLANT
FORCEPS BIOP RJ4 1.8 (CUTTING FORCEPS) IMPLANT
FORCEPS BIOP SUPERTRX PREMAR (INSTRUMENTS) ×3 IMPLANT
GAUZE SPONGE 4X4 12PLY STRL (GAUZE/BANDAGES/DRESSINGS) ×3 IMPLANT
GLOVE BIO SURGEON STRL SZ7.5 (GLOVE) ×3 IMPLANT
GLOVE SURG SS PI 7.5 STRL IVOR (GLOVE) ×6 IMPLANT
GOWN STRL REUS W/ TWL LRG LVL3 (GOWN DISPOSABLE) ×4 IMPLANT
GOWN STRL REUS W/TWL LRG LVL3 (GOWN DISPOSABLE) ×6
KIT CLEAN ENDO COMPLIANCE (KITS) ×6 IMPLANT
KIT LOCATABLE GUIDE (CANNULA) IMPLANT
KIT MARKER FIDUCIAL DELIVERY (KITS) IMPLANT
KIT PROCEDURE EDGE 180 (KITS) IMPLANT
KIT PROCEDURE EDGE 45 (KITS) IMPLANT
KIT PROCEDURE EDGE 90 (KITS) IMPLANT
KIT TURNOVER KIT B (KITS) ×3 IMPLANT
MARKER SKIN DUAL TIP RULER LAB (MISCELLANEOUS) ×3 IMPLANT
NDL EBUS SONO TIP PENTAX (NEEDLE) ×2 IMPLANT
NDL SUPERTRX PREMARK BIOPSY (NEEDLE) ×2 IMPLANT
NEEDLE EBUS SONO TIP PENTAX (NEEDLE) ×3 IMPLANT
NEEDLE SUPERTRX PREMARK BIOPSY (NEEDLE) ×3 IMPLANT
NS IRRIG 1000ML POUR BTL (IV SOLUTION) ×3 IMPLANT
OIL SILICONE PENTAX (PARTS (SERVICE/REPAIRS)) ×3 IMPLANT
PAD ARMBOARD 7.5X6 YLW CONV (MISCELLANEOUS) ×6 IMPLANT
PATCHES PATIENT (LABEL) ×9 IMPLANT
SOL ANTI FOG 6CC (MISCELLANEOUS) ×2 IMPLANT
SOLUTION ANTI FOG 6CC (MISCELLANEOUS) ×1
SYR 20CC LL (SYRINGE) ×6 IMPLANT
SYR 20ML ECCENTRIC (SYRINGE) ×6 IMPLANT
SYR 50ML SLIP (SYRINGE) ×3 IMPLANT
SYR 5ML LUER SLIP (SYRINGE) ×3 IMPLANT
TOWEL OR 17X24 6PK STRL BLUE (TOWEL DISPOSABLE) ×3 IMPLANT
TRAP SPECIMEN MUCOUS 40CC (MISCELLANEOUS) IMPLANT
TUBE CONNECTING 20X1/4 (TUBING) ×6 IMPLANT
UNDERPAD 30X30 (UNDERPADS AND DIAPERS) ×3 IMPLANT
VALVE BIOPSY  SINGLE USE (MISCELLANEOUS) ×3
VALVE BIOPSY SINGLE USE (MISCELLANEOUS) ×2 IMPLANT
VALVE DISPOSABLE (MISCELLANEOUS) ×3 IMPLANT
VALVE SUCTION BRONCHIO DISP (MISCELLANEOUS) ×3 IMPLANT
WATER STERILE IRR 1000ML POUR (IV SOLUTION) ×3 IMPLANT
superlock fiducial marker ×3 IMPLANT

## 2020-03-11 NOTE — Anesthesia Postprocedure Evaluation (Signed)
Anesthesia Post Note  Patient: Jillian Scott  Procedure(s) Performed: VIDEO BRONCHOSCOPY WITH ENDOBRONCHIAL NAVIGATION (Right ) VIDEO BRONCHOSCOPY WITH ENDOBRONCHIAL ULTRASOUND (Right ) BRONCHIAL BIOPSIES BRONCHIAL BRUSHINGS BRONCHIAL NEEDLE ASPIRATION BIOPSIES BRONCHIAL WASHINGS FIDUCIAL MARKER PLACEMENT     Patient location during evaluation: PACU Anesthesia Type: General Level of consciousness: awake and alert Pain management: pain level controlled Vital Signs Assessment: post-procedure vital signs reviewed and stable Respiratory status: spontaneous breathing, nonlabored ventilation, respiratory function stable and patient connected to nasal cannula oxygen Cardiovascular status: blood pressure returned to baseline and stable Postop Assessment: no apparent nausea or vomiting Anesthetic complications: no   No complications documented.  Last Vitals:  Vitals:   03/11/20 1337 03/11/20 1340  BP: (!) 147/69   Pulse: 82 77  Resp: (!) 26 (!) 21  Temp:  (!) 36.3 C  SpO2: 96% 96%    Last Pain:  Vitals:   03/11/20 1340  TempSrc:   PainSc: 0-No pain                 Effie Berkshire

## 2020-03-11 NOTE — Interval H&P Note (Signed)
History and Physical Interval Note:  03/11/2020 11:34 AM  Jillian Scott  has presented today for surgery, with the diagnosis of lung nodule.  The various methods of treatment have been discussed with the patient and family. After consideration of risks, benefits and other options for treatment, the patient has consented to  Procedure(s): VIDEO BRONCHOSCOPY WITH ENDOBRONCHIAL NAVIGATION (Right) VIDEO BRONCHOSCOPY WITH ENDOBRONCHIAL ULTRASOUND (Right) as a surgical intervention.  The patient's history has been reviewed, patient examined, no change in status, stable for surgery.  I have reviewed the patient's chart and labs.  Questions were answered to the patient's satisfaction.    Patient seen and evaluated in pre-op. All questions answered.   [place]

## 2020-03-11 NOTE — Discharge Instructions (Signed)
Flexible Bronchoscopy, Care After This sheet gives you information about how to care for yourself after your test. Your doctor may also give you more specific instructions. If you have problems or questions, contact your doctor. Follow these instructions at home: Eating and drinking  Do not eat or drink anything (not even water) for 2 hours after your test, or until your numbing medicine (local anesthetic) wears off.  When your numbness is gone and your cough and gag reflexes have come back, you may: ? Eat only soft foods. ? Slowly drink liquids.  The day after the test, go back to your normal diet. Driving  Do not drive for 24 hours if you were given a medicine to help you relax (sedative).  Do not drive or use heavy machinery while taking prescription pain medicine. General instructions   Take over-the-counter and prescription medicines only as told by your doctor.  Return to your normal activities as told. Ask what activities are safe for you.  Do not use any products that have nicotine or tobacco in them. This includes cigarettes and e-cigarettes. If you need help quitting, ask your doctor.  Keep all follow-up visits as told by your doctor. This is important. It is very important if you had a tissue sample (biopsy) taken. Get help right away if:  You have shortness of breath that gets worse.  You get light-headed.  You feel like you are going to pass out (faint).  You have chest pain.  You cough up: ? More than a little blood. ? More blood than before. Summary  Do not eat or drink anything (not even water) for 2 hours after your test, or until your numbing medicine wears off.  Do not use cigarettes. Do not use e-cigarettes.  Get help right away if you have chest pain.   This information is not intended to replace advice given to you by your health care provider. Make sure you discuss any questions you have with your health care provider. Document Released:  11/01/2008 Document Revised: 12/17/2016 Document Reviewed: 01/23/2016 Elsevier Patient Education  2020 [employer].

## 2020-03-11 NOTE — Transfer of Care (Signed)
Immediate Anesthesia Transfer of Care Note  Patient: Jillian Scott  Procedure(s) Performed: VIDEO BRONCHOSCOPY WITH ENDOBRONCHIAL NAVIGATION (Right ) VIDEO BRONCHOSCOPY WITH ENDOBRONCHIAL ULTRASOUND (Right ) BRONCHIAL BIOPSIES BRONCHIAL BRUSHINGS BRONCHIAL NEEDLE ASPIRATION BIOPSIES BRONCHIAL WASHINGS FIDUCIAL MARKER PLACEMENT  Patient Location: PACU  Anesthesia Type:General  Level of Consciousness: awake, alert  and oriented  Airway & Oxygen Therapy: Patient Spontanous Breathing  Post-op Assessment: Report given to RN and Post -op Vital signs reviewed and stable  Post vital signs: Reviewed and stable  Last Vitals:  Vitals Value Taken Time  BP    Temp    Pulse    Resp    SpO2      Last Pain:  Vitals:   03/11/20 0841  TempSrc: Oral  PainSc: 0-No pain         Complications: No complications documented.

## 2020-03-11 NOTE — Progress Notes (Signed)
Handoff completed with mary firr, CNA. Allergies, medications and history reviewed as well as confirmation of name and date of birth/procedure. No antibiotics ordered. No dentures or hearing aids present.

## 2020-03-11 NOTE — Anesthesia Procedure Notes (Signed)
Procedure Name: Intubation Date/Time: 03/11/2020 11:53 AM Performed by: Griffin Dakin, CRNA Pre-anesthesia Checklist: Patient identified, Emergency Drugs available, Suction available and Patient being monitored Patient Re-evaluated:Patient Re-evaluated prior to induction Oxygen Delivery Method: Circle system utilized Preoxygenation: Pre-oxygenation with 100% oxygen Induction Type: IV induction Ventilation: Mask ventilation without difficulty Laryngoscope Size: Mac and 3 Grade View: Grade II Tube type: Oral Number of attempts: 1 Airway Equipment and Method: Stylet and Oral airway Placement Confirmation: ETT inserted through vocal cords under direct vision,  positive ETCO2 and breath sounds checked- equal and bilateral Secured at: 24 cm Tube secured with: Tape Dental Injury: Teeth and Oropharynx as per pre-operative assessment

## 2020-03-11 NOTE — Anesthesia Preprocedure Evaluation (Addendum)
Anesthesia Evaluation  Patient identified by MRN, date of birth, ID band Patient awake    Reviewed: Allergy & Precautions, NPO status , Patient's Chart, lab work & pertinent test results  Airway Mallampati: II  TM Distance: >3 FB Neck ROM: Full    Dental  (+) Teeth Intact, Dental Advisory Given   Pulmonary    breath sounds clear to auscultation       Cardiovascular negative cardio ROS   Rhythm:Regular Rate:Normal     Neuro/Psych  Neuromuscular disease negative psych ROS   GI/Hepatic Neg liver ROS, GERD  ,  Endo/Other  Hypothyroidism   Renal/GU negative Renal ROS     Musculoskeletal negative musculoskeletal ROS (+)   Abdominal Normal abdominal exam  (+)   Peds  Hematology negative hematology ROS (+)   Anesthesia Other Findings - HLD  Reproductive/Obstetrics                            Anesthesia Physical Anesthesia Plan  ASA: III  Anesthesia Plan: General   Post-op Pain Management:    Induction: Intravenous  PONV Risk Score and Plan: 3 and Ondansetron and Treatment may vary due to age or medical condition  Airway Management Planned: Oral ETT  Additional Equipment: None  Intra-op Plan:   Post-operative Plan: Extubation in OR  Informed Consent: I have reviewed the patients History and Physical, chart, labs and discussed the procedure including the risks, benefits and alternatives for the proposed anesthesia with the patient or authorized representative who has indicated his/her understanding and acceptance.     Dental advisory given  Plan Discussed with: CRNA  Anesthesia Plan Comments: (Echo:  - Left ventricle: The cavity size was normal. Systolic function was  normal. The estimated ejection fraction was in the range of 55%  to 60%. Although no diagnostic regional wall motion abnormality  was identified, this possibility cannot be completely excluded on  the  basis of this study. Left ventricular diastolic function  parameters were normal.  - Aortic valve: There was trivial regurgitation.  - Tricuspid valve: There was mild-moderate regurgitation directed  centrally. )       Anesthesia Quick Evaluation

## 2020-03-11 NOTE — Op Note (Addendum)
Video Bronchoscopy with Electromagnetic Navigation Procedure Note Video bronchoscopy with electromagnetic navigation fiducial placement. Video Bronchoscopy with Endobronchial Ultrasound Procedure Note  Date of Operation: 03/11/2020  Pre-op Diagnosis: Right upper lobe lung nodule  Post-op Diagnosis: Right upper lobe lung nodule  Surgeon: Garner Nash, DO   Assistants: None   Anesthesia: General endotracheal anesthesia  Operation: Flexible video fiberoptic bronchoscopy with electromagnetic navigation and biopsies.  Estimated Blood Loss: Minimal  Complications: None   Indications and History: Jillian Scott is a 85 y.o. female with right upper lobe lung nodule.  The risks, benefits, complications, treatment options and expected outcomes were discussed with the patient.  The possibilities of pneumothorax, pneumonia, reaction to medication, pulmonary aspiration, perforation of a viscus, bleeding, failure to diagnose a condition and creating a complication requiring transfusion or operation were discussed with the patient who freely signed the consent.    Navigational bronchoscopy description of Procedure: The patient was seen in the Preoperative Area, was examined and was deemed appropriate to proceed.  The patient was taken to [facility] endoscopy room 2, identified as Jillian Scott and the procedure verified as Flexible Video Fiberoptic Bronchoscopy.  A Time Out was held and the above information confirmed.   Prior to the date of the procedure a high-resolution CT scan of the chest was performed. Utilizing [place] a virtual tracheobronchial tree was generated to allow the creation of distinct navigation pathways to the patient's parenchymal abnormalities. After being taken to the operating room general anesthesia was initiated and the patient  was orally intubated. The video fiberoptic bronchoscope was introduced via the endotracheal tube and a general inspection was performed  which showed normal right and left lung anatomy with no evidence of endobronchial lesion. The extendable working channel and locator guide were introduced into the bronchoscope. The distinct navigation pathways prepared prior to this procedure were then utilized to navigate to within 0.5 cm of patient's lesion(s) identified on CT scan.  Full fluoroscopic sweep was obtained with inspiratory breath-hold APL 20 cm of water, from RAO 25 degrees to LAO 25 degrees. The extendable working channel was secured into place and the locator guide was withdrawn. Under fluoroscopic guidance transbronchial needle brushings, transbronchial Wang needle biopsies, and transbronchial forceps biopsies were performed to be sent for cytology and pathology. A bronchioalveolar lavage was performed in the right upper lobe and sent for cytology. Following tissue sampling 3 fiducials were placed within 3 separate axial planes at approximately 2.5 cm from lesional center using the fiducial guide catheter and wire delivery under direct fluoroscopic guidance.  Endobronchial ultrasound description of Procedure: The standard scope was then withdrawn and the endobronchial ultrasound was used to identify and characterize the peritracheal, hilar and bronchial lymph nodes. Inspection showed no enlarged lymph nodes within the right hilum.  The PET scan did reveal a small area of uptake along the anterior portion of the right hilar wall.  This was not visualized under ultrasound today.  There was no obvious nodal structure identified.  There was some connective tissue present within the subcarinal space.  A small nodal structure present at approximately 6 mm in largest cross-section.  No samples were taken.  Standard therapeutic bronchoscope was reinserted to the airway and bilateral mainstem's were aspirated for clearance of any remaining blood clots debris from the airway.  At the termination of the procedure the all distal subsegments of airways  were patent. Bronchoscope was brought to just above the main carina and there was no evidence of active bleeding.  At the end of the procedure a general airway inspection was performed and there was no evidence of active bleeding. The bronchoscope was removed.  The patient tolerated the procedure well. There was no significant blood loss and there were no obvious complications. A post-procedural chest x-ray is pending.  Navigational bronchoscopy right upper lobe samples: 1. Transbronchial needle brushings from right upper lobe 2. Transbronchial Wang needle biopsies from right upper lobe 3. Transbronchial forceps biopsies from right upper lobe 4. Bronchoalveolar lavage from right upper lobe  Plans:  The patient will be discharged from the PACU to home when recovered from anesthesia and after chest x-ray is reviewed. We will review the cytology, pathology and microbiology results with the patient when they become available. Outpatient followup will be with Dr. [name].   Garner Nash, DO [facility] 03/11/2020 12:56 PM

## 2020-03-12 ENCOUNTER — Encounter (HOSPITAL_COMMUNITY): Payer: Self-pay | Admitting: Pulmonary Disease

## 2020-03-12 LAB — CYTOLOGY - NON PAP

## 2020-03-13 ENCOUNTER — Encounter: Payer: Self-pay | Admitting: *Deleted

## 2020-03-13 DIAGNOSIS — R911 Solitary pulmonary nodule: Secondary | ICD-10-CM

## 2020-03-13 NOTE — Progress Notes (Signed)
I followed up with Dr. Valeta Harms on Ms. Sanger's case.  He updated me to make referral to rad onc. I will complete referral.

## 2020-03-17 ENCOUNTER — Ambulatory Visit: Payer: Medicare Other | Admitting: Pulmonary Disease

## 2020-03-17 ENCOUNTER — Other Ambulatory Visit: Payer: Self-pay

## 2020-03-17 ENCOUNTER — Encounter: Payer: Self-pay | Admitting: *Deleted

## 2020-03-17 ENCOUNTER — Encounter: Payer: Self-pay | Admitting: Pulmonary Disease

## 2020-03-17 DIAGNOSIS — J3489 Other specified disorders of nose and nasal sinuses: Secondary | ICD-10-CM | POA: Diagnosis not present

## 2020-03-17 DIAGNOSIS — C3491 Malignant neoplasm of unspecified part of right bronchus or lung: Secondary | ICD-10-CM

## 2020-03-17 NOTE — Assessment & Plan Note (Signed)
Has follow-up with ENT scheduled for March

## 2020-03-17 NOTE — Patient Instructions (Signed)
  We discussed biopsy results Keep appt with radiation doctor tomorrow

## 2020-03-17 NOTE — Assessment & Plan Note (Addendum)
We discussed biopsy results today and implications.  Husband was present Appears to be stage I-no CT correlate in the mediastinum and EBUS did not show any enlarged hilar or mediastinal lymph node. She is thin and frail and I doubt that she would tolerate surgical procedure.  We will refer her to radiation oncology to proceed with SBRT.  She is willing and able to proceed, I discussed risks and benefits

## 2020-03-17 NOTE — Progress Notes (Signed)
   Subjective:    Patient ID: Jillian Scott, female    DOB: 1935/01/27, 85 y.o.   MRN: [id number]  HPI 85 yonever smoker for FUof pulmonary nodule incidentally found when she had a fall and underwent CT chest and abdomen in 12/2017 PET scan 08/2018 showed mild hypermetabolism, SUV 2.3.  Follow-up scans have shown slight increase in growth. Underwent ENB >> malignant cells consistent with non-small cell carcinoma Some hypermetabolism noted in the right hilum but no lymph node correlate on noncontrast CT scan and EBUS did not show enlarged lymph nodes  She denies any problems after the procedure, no hemoptysis  Significant tests/ events reviewed  PET 01/2020 >> Persistent increased metabolic activity in the spiculated pulmonary nodule SUV 2.2  , Rt hilum ,RIGHT maxillary sinus. 09/2019 CT chest WO con >>13 x 9 x 17 mm spiculated nodular density in the RIGHTupper lobe, previously 13 x 8 x 16 mm. This measured 12 x 8 x 12 mm on 01/09/2018.  03/2019 CT chestStable spiculated right upper lobe nodule12.8 mm  12/2019CT chest/abdomen/pelvis with contrast-4 cm benign pericardial cyst. It also showed a 14 mm spiculated nodule in the right upper lobe    Review of Systems neg for any significant sore throat, dysphagia, itching, sneezing, nasal congestion or excess/ purulent secretions, fever, chills, sweats, unintended wt loss, pleuritic or exertional cp, hempoptysis, orthopnea pnd or change in chronic leg swelling. Also denies presyncope, palpitations, heartburn, abdominal pain, nausea, vomiting, diarrhea or change in bowel or urinary habits, dysuria,hematuria, rash, arthralgias, visual complaints, headache, numbness weakness or ataxia.     Objective:   Physical Exam  Gen. Pleasant, well-nourished, elderly,in no distress ENT - no thrush, no pallor/icterus,no post nasal drip Neck: No JVD, no thyromegaly, no carotid bruits Lungs: no use of accessory muscles, no dullness to  percussion, clear without rales or rhonchi  Cardiovascular: Rhythm regular, heart sounds  normal, no murmurs or gallops, no peripheral edema Musculoskeletal: No deformities, no cyanosis or clubbing         Assessment & Plan:

## 2020-03-19 ENCOUNTER — Other Ambulatory Visit: Payer: Self-pay

## 2020-03-19 ENCOUNTER — Encounter: Payer: Self-pay | Admitting: Radiation Oncology

## 2020-03-19 ENCOUNTER — Ambulatory Visit
Admission: RE | Admit: 2020-03-19 | Discharge: 2020-03-19 | Disposition: A | Discharge status: Home / Self Care | Payer: Medicare Other | Source: Ambulatory Visit | Attending: Radiation Oncology | Admitting: Radiation Oncology

## 2020-03-19 VITALS — BP 144/81 | HR 76 | Resp 20 | Wt 111.5 lb

## 2020-03-19 DIAGNOSIS — Z923 Personal history of irradiation: Secondary | ICD-10-CM | POA: Diagnosis not present

## 2020-03-19 DIAGNOSIS — Z7982 Long term (current) use of aspirin: Secondary | ICD-10-CM | POA: Diagnosis not present

## 2020-03-19 DIAGNOSIS — C3491 Malignant neoplasm of unspecified part of right bronchus or lung: Secondary | ICD-10-CM

## 2020-03-19 DIAGNOSIS — R220 Localized swelling, mass and lump, head: Secondary | ICD-10-CM | POA: Insufficient documentation

## 2020-03-19 DIAGNOSIS — I318 Other specified diseases of pericardium: Secondary | ICD-10-CM | POA: Insufficient documentation

## 2020-03-19 DIAGNOSIS — C3411 Malignant neoplasm of upper lobe, right bronchus or lung: Secondary | ICD-10-CM | POA: Diagnosis not present

## 2020-03-19 DIAGNOSIS — Z79899 Other long term (current) drug therapy: Secondary | ICD-10-CM | POA: Diagnosis not present

## 2020-03-19 DIAGNOSIS — R0602 Shortness of breath: Secondary | ICD-10-CM | POA: Insufficient documentation

## 2020-03-19 DIAGNOSIS — E785 Hyperlipidemia, unspecified: Secondary | ICD-10-CM | POA: Insufficient documentation

## 2020-03-19 DIAGNOSIS — I700 Atherosclerosis of aorta: Secondary | ICD-10-CM | POA: Diagnosis not present

## 2020-03-19 DIAGNOSIS — I2510 Atherosclerotic heart disease of native coronary artery without angina pectoris: Secondary | ICD-10-CM | POA: Diagnosis not present

## 2020-03-19 DIAGNOSIS — E039 Hypothyroidism, unspecified: Secondary | ICD-10-CM | POA: Diagnosis not present

## 2020-03-19 NOTE — Progress Notes (Signed)
Radiation Oncology         [phone] ________________________________  Initial Outpatient Consultation  Name: Jillian Scott MRN: [id number]  Date: 03/19/2020  DOB: 1935/08/30  CC:Kim, Jeneen Rinks, MD  Garner Nash, DO   REFERRING PHYSICIAN: Garner Nash, DO  DIAGNOSIS: The encounter diagnosis was Non-small cell cancer of right lung ([place]).  Stage I non-small cell carcinoma of the right upper lobe  HISTORY OF PRESENT ILLNESS::Jillian Scott is a 85 y.o. female who is seen as a courtesy of Dr. Valeta Harms for an opinion concerning radiation therapy as part of management for her recently diagnosed lung cancer. Today, she is accompanied by her son. The patient underwent a chest CT scan on 01/09/2018 status post fall. Incidentally, it showed a 14 mm spiculated mass in the right upper lobe that was worrisome for lung cancer.  Follow-up chest CT scan on 05/22/2018 showed a grossly stable 13 mm spiculated density noted in the right upper.   PET scan on 08/21/2018 showed that the spiculated and centrally cavitary right upper lobe nodule demonstrated metabolic activity similar to blood pool. Although reassuring, the nodule was morphologically suspicious and adenocarcinoma was not completely excluded. There was no evidence of metastatic disease. 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  Follow-up chest CT scan on 03/22/2019 showed a stable spiculated right upper lobe nodule that was similar to prior exams.  Follow-up chest CT scan on 10/04/2019 showed a slight interval increase in size of a spiculated right upper lobe pulmonary nodule that measured 13 x 9 x 17 mm, previously 13 x 8 x 12 mm. Despite the low level of FDG accumulation on PET-CT, due to characteristics and increase in size since 2019, the finding remained concerning for pulmonary malignancy.   PET  scan on 01/23/2020 showed persistent low-level uptake in the slowly enlarging spiculated pulmonary nodule, which remained concerning for indolent bronchogenic neoplasm, but activity remained similar to previous PET imaging. There was persistent increased metabolic activity in the right maxillary sinus. Soft tissue in that location wall was markedly diminished along the anterior wall of the maxillary sinus. Residual or recurrent sinonasal papilloma was considered. Additionally, there was activity about the right hilum that was non-specific and without CT correlate, possibly representing a small reactive lymph node or even variation and blood pool in that location. Finally, there was asymmetric uptake in the supraclavicular fossa on the left that was likely muscular or brown fat activity with asymmetry; subtle areas of uptake noted on the right with similar distribution when viewed in the coronal plane.  Follow-up chest CT scan on 02/28/2020 showed a 1.3 cm spiculated right upper lobe pulmonary nodule that was unchanged in the interval. Imaging features remained compatible with primary bronchogenic neoplasm.  Subsequently, the patient was seen by Dr. Valeta Harms, who performed a video bronchoscopy with electromagnetic navigation on 03/11/2020. Cytology from the procedure revealed malignant cells consistent with non-small cell carcinoma of the right upper lobe brushing and fine needle aspiration.  In light of the patient's age and overall performance status she is not felt to be a good candidate for surgery and is now referred to radiation oncology for consideration for SBRT for clinical stage I non-small cell lung cancer.  The patient was noted to have uptake in the maxillary sinus area on PET scan.  Concern was possible malignancy in this area.  The patient was seen by Dr. 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  PREVIOUS RADIATION  THERAPY: No  PAST MEDICAL HISTORY:  Past Medical History:  Diagnosis Date  . Dyspnea   . Hyperlipidemia   . Hypothyroidism   . Maxillary sinus mass     PAST SURGICAL HISTORY: Past Surgical History:  Procedure Laterality Date  . ABDOMINAL HYSTERECTOMY    . BRONCHIAL BIOPSY  03/11/2020   Procedure: BRONCHIAL BIOPSIES;  Surgeon: Garner Nash, DO;  Location: [facility];  Service: Pulmonary;;  . BRONCHIAL BRUSHINGS  03/11/2020   Procedure: BRONCHIAL BRUSHINGS;  Surgeon: Garner Nash, DO;  Location: [facility];  Service: Pulmonary;;  . BRONCHIAL NEEDLE ASPIRATION BIOPSY  03/11/2020   Procedure: BRONCHIAL NEEDLE ASPIRATION BIOPSIES;  Surgeon: Garner Nash, DO;  Location: [facility];  Service: Pulmonary;;  . BRONCHIAL WASHINGS  03/11/2020   Procedure: BRONCHIAL WASHINGS;  Surgeon: Garner Nash, DO;  Location: [facility];  Service: Pulmonary;;  . COLONOSCOPY    . FIDUCIAL MARKER PLACEMENT  03/11/2020   Procedure: FIDUCIAL MARKER PLACEMENT;  Surgeon: Garner Nash, DO;  Location: [facility];  Service: Pulmonary;;  . MAXILLARY ANTROSTOMY Right 10/20/2018   Procedure: MAXILLARY ANTROSTOMY WITH TISSUE REMOVAL;  Surgeon: Leta Baptist, MD;  Location: [facility];  Service: ENT;  Laterality: Right;  . MAXILLARY ANTROSTOMY WITH TISSUE REMOVAL    . VIDEO BRONCHOSCOPY WITH ENDOBRONCHIAL NAVIGATION Right 03/11/2020   Procedure: VIDEO BRONCHOSCOPY WITH ENDOBRONCHIAL NAVIGATION;  Surgeon: Garner Nash, DO;  Location: [facility];  Service: Pulmonary;  Laterality: Right;  Marland Kitchen VIDEO BRONCHOSCOPY WITH ENDOBRONCHIAL ULTRASOUND Right 03/11/2020   Procedure: VIDEO BRONCHOSCOPY WITH ENDOBRONCHIAL ULTRASOUND;  Surgeon: Garner Nash, DO;  Location: [facility];  Service: Pulmonary;  Laterality: Right;    FAMILY HISTORY:  Family History  Problem Relation Age of Onset  . Breast cancer Neg Hx     SOCIAL HISTORY:  Social History   Tobacco Use  . Smoking status: Never  Smoker  . Smokeless tobacco: Never Used  Vaping Use  . Vaping Use: Never used  Substance Use Topics  . Alcohol use: No  . Drug use: No    ALLERGIES: No Known Allergies  MEDICATIONS:  Current Outpatient Medications  Medication Sig Dispense Refill  . ARTIFICIAL TEAR SOLUTION OP Place 2 drops into both eyes daily.    Marland Kitchen aspirin EC 81 MG tablet Take 81 mg by mouth daily. Swallow whole.    . Cholecalciferol (VITAMIN D3) 10 MCG (400 UNIT) CAPS Take 400 Units by mouth daily.    Marland Kitchen levothyroxine (SYNTHROID) 50 MCG tablet Take 50 mcg by mouth daily.    . rosuvastatin (CRESTOR) 10 MG tablet Take 10 mg by mouth daily.    . vitamin B-12 (CYANOCOBALAMIN) 500 MCG tablet Take 500 mcg by mouth daily.     No current facility-administered medications for this encounter.    REVIEW OF SYSTEMS:  A 10+ POINT REVIEW OF SYSTEMS WAS OBTAINED including neurology, dermatology, psychiatry, cardiac, respiratory, lymph, extremities, GI, GU, musculoskeletal, constitutional, reproductive, HEENT.  She denies any pain within the chest area significant cough or shortness of breath.  Patient denies any hemoptysis.  She is a never smoker   PHYSICAL EXAM:  weight is 111 lb 8 oz (50.6 kg). Her blood pressure is 144/81 (abnormal) and her pulse is 76. Her respiration is 20 and oxygen saturation is 100%.   General: Alert and oriented, in no acute distress, somewhat hard of hearing, somewhat  frail HEENT: Head is normocephalic. Extraocular movements are intact.  Neck: Neck is supple, no palpable cervical or supraclavicular lymphadenopathy. Heart: Regular in rate and rhythm with no murmurs, rubs, or gallops. Chest: Clear to auscultation bilaterally, with no rhonchi, wheezes, or rales. Abdomen: Soft, nontender, nondistended, with no rigidity or guarding. Extremities: No cyanosis or edema. Lymphatics: see Neck Exam Skin: No concerning lesions. Musculoskeletal: symmetric strength and muscle tone throughout. Neurologic: Cranial  nerves II through XII are grossly intact. No obvious focalities. Speech is fluent. Coordination is intact. Psychiatric: Judgment and insight are intact. Affect is appropriate.   ECOG = 1  0 - Asymptomatic (Fully active, able to carry on all predisease activities without restriction)  1 - Symptomatic but completely ambulatory (Restricted in physically strenuous activity but ambulatory and able to carry out work of a light or sedentary nature. For example, light housework, office work)  2 - Symptomatic, <50% in bed during the day (Ambulatory and capable of all self care but unable to carry out any work activities. Up and about more than 50% of waking hours)  3 - Symptomatic, >50% in bed, but not bedbound (Capable of only limited self-care, confined to bed or chair 50% or more of waking hours)  4 - Bedbound (Completely disabled. Cannot carry on any self-care. Totally confined to bed or chair)  5 - Death   Eustace Pen MM, Creech RH, Tormey DC, et al. [phone]). "Toxicity and response criteria of the [facility]". [place] Oncol. 5 (6): 649-55  LABORATORY DATA:  Lab Results  Component Value Date   WBC 6.5 01/09/2018   HGB 13.9 01/09/2018   HCT 42.2 01/09/2018   MCV 89.6 01/09/2018   PLT 223 01/09/2018   NEUTROABS 4.8 01/09/2018   Lab Results  Component Value Date   NA 135 03/06/2019   K 4.8 03/06/2019   CL 98 03/06/2019   CO2 31 03/06/2019   GLUCOSE 100 (H) 03/06/2019   CREATININE 0.81 03/06/2019   CALCIUM 10.2 03/06/2019      RADIOGRAPHY: DG CHEST PORT 1 VIEW  Result Date: 03/11/2020 CLINICAL DATA:  Status post bronchoscopy. EXAM: PORTABLE CHEST 1 VIEW COMPARISON:  CT chest dated February 28, 2020. FINDINGS: The heart size and mediastinal contours are within normal limits. Normal pulmonary vascularity. There are three new fiducial markers in the right upper lobe at the site of the known pulmonary nodule with mild surrounding patchy opacity. The remaining lungs  are clear. No pleural effusion or pneumothorax. No acute osseous abnormality. IMPRESSION: 1. New fiducial markers in the right upper lobe at the site of the known pulmonary nodule with small amount of surrounding post-procedure pulmonary hemorrhage. No pneumothorax. Electronically Signed   By: Titus Dubin M.D.   On: 03/11/2020 13:52   CT Super D Chest Wo Contrast  Result Date: 02/29/2020 CLINICAL DATA:  Pre-procedure planning for a lung nodule. EXAM: CT CHEST WITHOUT CONTRAST TECHNIQUE: Multidetector CT imaging of the chest was performed using thin slice collimation for electromagnetic bronchoscopy planning purposes, without intravenous contrast. COMPARISON:  PET-CT 01/23/2020 FINDINGS: Cardiovascular: The heart size is normal. No substantial pericardial effusion. Coronary artery calcification is evident. Ascending thoracic aorta measures 3.6 cm diameter today compared to 2.3 cm diameter descending thoracic aorta. Mediastinum/Nodes: No mediastinal lymphadenopathy. No evidence for gross hilar lymphadenopathy although assessment is limited by the lack of intravenous contrast on today's study. The esophagus has normal imaging features. There is no axillary lymphadenopathy. None low-density structure low right mediastinum is stable and  was not hypermetabolic on previous PET imaging, compatible with a pericardial cyst. Lungs/Pleura: 1.3 cm spiculated right upper lobe pulmonary nodule again noted. No new suspicious pulmonary nodule or mass. No focal airspace consolidation. No pleural effusion. Upper Abdomen: Similar hypodensities lateral segment left liver and posterior right hepatic dome, likely cyst. Musculoskeletal: No worrisome lytic or sclerotic osseous abnormality. IMPRESSION: 1. 1.3 cm spiculated right upper lobe pulmonary nodule, unchanged in the interval. Imaging features remain compatible with primary bronchogenic neoplasm. 2. Stable pericardial cyst. 3. Aortic Atherosclerosis (ICD10-I70.0).  Electronically Signed   By: Misty Stanley M.D.   On: 02/29/2020 09:29   DG C-ARM BRONCHOSCOPY  Result Date: 03/11/2020 C-ARM BRONCHOSCOPY: Fluoroscopy was utilized by the requesting physician.  No radiographic interpretation.      IMPRESSION: Clinical Stage I non-small cell carcinoma of the right upper lobe  The patient will be an excellent candidate for stereotactic body radiation therapy directed at the solitary lesion in the right upper lobe.  The location and size of the lesion makes her an ideal candidate for this special type of radiation therapy.  Today, I talked to the patient and son about the findings and work-up thus far.  We discussed the natural history of lung cancer and general treatment, highlighting the role of radiotherapy (SBRT) in the management.  We discussed the available radiation techniques, and focused on the details of logistics and delivery.  We reviewed the anticipated acute and late sequelae associated with radiation in this setting.  The patient was encouraged to ask questions that I answered to the best of my ability.  A patient consent form was discussed and signed.  We retained a copy for our records.  The patient would like to proceed with radiation and will be scheduled for CT simulation.  PLAN: Patient will return next Wednesday, March 9 for SBRT simulation with treatments to begin approximately a week later.  Anticipate 3 radiation treatments directed at the solitary non-small cell lung cancer in the right upper lobe.  Total time spent in this encounter was 60 minutes which included reviewing the patient's most recent chest CT scans, PET scans, consultations, follow-ups, bronchoscopy, cytology report, physical examination, and documentation.   ------------------------------------------------  Blair Promise, PhD, MD  This document serves as a record of services personally performed by Gery Pray, MD. It was created on his behalf by Clerance Lav, a trained  medical scribe. The creation of this record is based on the scribe's personal observations and the provider's statements to them. This document has been checked and approved by the attending provider.

## 2020-03-19 NOTE — Progress Notes (Signed)
Thoracic Location of Tumor Rt upper lung   Biopsies of 03/11/2020 CYTOLOGY - NON PAP  CASE: [id number]  PATIENT: Karrissa Trachtenberg  Non-Gynecological Cytology Report      Clinical History: Lung nodule    FINAL MICROSCOPIC DIAGNOSIS:   A. LUNG, RUL, BRUSH:  - Malignant cells consistent with non-small cell carcinoma   B. LUNG, RUL, FINE NEEDLE ASPIRATION:  - Malignant cells consistent with non-small cell carcinoma   COMMENT:   Dr. Jeannie Done reviewed the case and agrees with the above diagnosis. Dr.  Valeta Harms was notified of these results on March 12, 2020.   SPECIMEN ADEQUACY:  A. Satisfactory for Evaluation  B. Satisfactory for Evaluation   GROSS:  Received is/are (A)(1)2 Slides (1 Quick stain). (2)2 Slides (1 Quick  stain). (3)2 Slides (1 Quick stain). (B)(1)2 Slides (1 Quick stain).  Also received are 25cc's of red saline solution from needle rinses.  Biopsy / tissue pieces were included for cell block.(BEE:GW:gw)  Smears: (A)6 (B)2  Concentration Method (ThinPrep): (A)0 (B)1  Cell Block: (A)0 (B)1 Conventional  Additional Studies: Bronch Lavage (C) [id number] Clinical History: Lung nodule  Specimen Submitted: C. LUNG, RUL, LAVAGE:    FINAL MICROSCOPIC DIAGNOSIS:  - Atypical cells present   SPECIMEN ADEQUACY:  Satisfactory for evaluation   DIAGNOSTIC COMMENTS:  Dr. Jeannie Done reviewed the case and agrees with the above diagnosis.  Please see concurrent case [id number].   GROSS:  Received is/are 35cc's of bloody, red fluid with mucoid material.(GW:gw)  Smears: 2  Concentration Method (ThinPrep): 0  Cell Block: 0  Additional Studies: NAV (A B) [id number].   Tobacco/Marijuana/Snuff/ETOH use:   Past/Anticipated interventions by cardiothoracic surgery, if any:  03/11/2020 Dr Valeta Harms   VIDEO BRONCHOSCOPY WITH ENDOBRONCHIAL NAVIGATION Right General  VIDEO BRONCHOSCOPY WITH ENDOBRONCHIAL ULTRASOUND Right General  BRONCHIAL BIOPSIES    BRONCHIAL BRUSHINGS     BRONCHIAL NEEDLE ASPIRATION BIOPSIES    BRONCHIAL WASHINGS    FIDUCIAL MARKER PLACEMENT     Vitals:   03/19/20 1420  BP: (!) 144/81  Pulse: 76  Resp: 20  SpO2: 100%  Weight: 50.6 kg      Past/Anticipated interventions by medical oncology, if any:   Signs/Symptoms  Weight changes, if any: Patient states that she  has lost 20 since she started her thyroid medication  Respiratory complaints, if any:  With activity   Hemoptysis, if any:  None  Pain issues, if any:   None  SAFETY ISSUES:  Prior radiation?  No  Pacemaker/ICD?  No  Possible current pregnancy? No  Is the patient on methotrexate?      no  Current Complaints / other details:

## 2020-03-20 ENCOUNTER — Other Ambulatory Visit: Payer: Self-pay | Admitting: *Deleted

## 2020-03-20 NOTE — Progress Notes (Signed)
The proposed treatment discussed in cancer conference 03/20/20 is for discussion purpose only and is not a binding recommendation.  The patient was not physically examined nor present for their treatment options.  Therefore, final treatment plans cannot be decided.

## 2020-03-26 ENCOUNTER — Ambulatory Visit
Admission: RE | Admit: 2020-03-26 | Discharge: 2020-03-26 | Disposition: A | Discharge status: Home / Self Care | Payer: Medicare Other | Source: Ambulatory Visit | Attending: Radiation Oncology | Admitting: Radiation Oncology

## 2020-03-26 DIAGNOSIS — Z510 Encounter for antineoplastic radiation therapy: Secondary | ICD-10-CM | POA: Diagnosis not present

## 2020-03-26 DIAGNOSIS — C3411 Malignant neoplasm of upper lobe, right bronchus or lung: Secondary | ICD-10-CM | POA: Diagnosis not present

## 2020-03-26 DIAGNOSIS — C3491 Malignant neoplasm of unspecified part of right bronchus or lung: Secondary | ICD-10-CM

## 2020-04-02 DIAGNOSIS — Z510 Encounter for antineoplastic radiation therapy: Secondary | ICD-10-CM | POA: Diagnosis not present

## 2020-04-02 DIAGNOSIS — C3411 Malignant neoplasm of upper lobe, right bronchus or lung: Secondary | ICD-10-CM | POA: Diagnosis not present

## 2020-04-03 ENCOUNTER — Ambulatory Visit
Admission: RE | Admit: 2020-04-03 | Discharge: 2020-04-03 | Disposition: A | Discharge status: Home / Self Care | Payer: Medicare Other | Source: Ambulatory Visit | Attending: Radiation Oncology | Admitting: Radiation Oncology

## 2020-04-03 ENCOUNTER — Other Ambulatory Visit: Payer: Self-pay

## 2020-04-03 DIAGNOSIS — C3491 Malignant neoplasm of unspecified part of right bronchus or lung: Secondary | ICD-10-CM

## 2020-04-03 DIAGNOSIS — Z510 Encounter for antineoplastic radiation therapy: Secondary | ICD-10-CM | POA: Diagnosis not present

## 2020-04-03 DIAGNOSIS — C3411 Malignant neoplasm of upper lobe, right bronchus or lung: Secondary | ICD-10-CM | POA: Diagnosis not present

## 2020-04-04 ENCOUNTER — Ambulatory Visit: Payer: Medicare Other | Admitting: Radiation Oncology

## 2020-04-07 ENCOUNTER — Ambulatory Visit: Payer: Medicare Other | Admitting: Radiation Oncology

## 2020-04-08 ENCOUNTER — Ambulatory Visit
Admission: RE | Admit: 2020-04-08 | Discharge: 2020-04-08 | Disposition: A | Discharge status: Home / Self Care | Payer: Medicare Other | Source: Ambulatory Visit | Attending: Radiation Oncology | Admitting: Radiation Oncology

## 2020-04-08 ENCOUNTER — Other Ambulatory Visit: Payer: Self-pay

## 2020-04-08 DIAGNOSIS — C3491 Malignant neoplasm of unspecified part of right bronchus or lung: Secondary | ICD-10-CM

## 2020-04-08 DIAGNOSIS — C3411 Malignant neoplasm of upper lobe, right bronchus or lung: Secondary | ICD-10-CM | POA: Diagnosis not present

## 2020-04-08 DIAGNOSIS — Z510 Encounter for antineoplastic radiation therapy: Secondary | ICD-10-CM | POA: Diagnosis not present

## 2020-04-10 ENCOUNTER — Other Ambulatory Visit: Payer: Self-pay

## 2020-04-10 ENCOUNTER — Ambulatory Visit
Admission: RE | Admit: 2020-04-10 | Discharge: 2020-04-10 | Disposition: A | Discharge status: Home / Self Care | Payer: Medicare Other | Source: Ambulatory Visit | Attending: Radiation Oncology | Admitting: Radiation Oncology

## 2020-04-10 ENCOUNTER — Encounter: Payer: Self-pay | Admitting: Radiation Oncology

## 2020-04-10 DIAGNOSIS — C3491 Malignant neoplasm of unspecified part of right bronchus or lung: Secondary | ICD-10-CM

## 2020-04-10 DIAGNOSIS — Z510 Encounter for antineoplastic radiation therapy: Secondary | ICD-10-CM | POA: Diagnosis not present

## 2020-04-10 DIAGNOSIS — C3411 Malignant neoplasm of upper lobe, right bronchus or lung: Secondary | ICD-10-CM | POA: Diagnosis not present

## 2020-04-17 DIAGNOSIS — J343 Hypertrophy of nasal turbinates: Secondary | ICD-10-CM | POA: Diagnosis not present

## 2020-04-17 DIAGNOSIS — J320 Chronic maxillary sinusitis: Secondary | ICD-10-CM | POA: Diagnosis not present

## 2020-04-17 DIAGNOSIS — J310 Chronic rhinitis: Secondary | ICD-10-CM | POA: Diagnosis not present

## 2020-05-12 ENCOUNTER — Encounter: Payer: Self-pay | Admitting: Radiation Oncology

## 2020-05-19 ENCOUNTER — Ambulatory Visit: Payer: Self-pay | Admitting: Radiation Oncology

## 2020-05-22 ENCOUNTER — Other Ambulatory Visit: Payer: Self-pay

## 2020-05-22 ENCOUNTER — Ambulatory Visit
Admission: RE | Admit: 2020-05-22 | Discharge: 2020-05-22 | Disposition: A | Discharge status: Home / Self Care | Payer: Medicare Other | Source: Ambulatory Visit | Attending: Radiation Oncology | Admitting: Radiation Oncology

## 2020-05-22 ENCOUNTER — Encounter: Payer: Self-pay | Admitting: Radiation Oncology

## 2020-05-22 VITALS — BP 138/77 | HR 71 | Temp 96.9°F | Resp 18 | Ht 63.0 in | Wt 107.1 lb

## 2020-05-22 DIAGNOSIS — C3491 Malignant neoplasm of unspecified part of right bronchus or lung: Secondary | ICD-10-CM

## 2020-05-22 DIAGNOSIS — Z7982 Long term (current) use of aspirin: Secondary | ICD-10-CM | POA: Insufficient documentation

## 2020-05-22 DIAGNOSIS — R1310 Dysphagia, unspecified: Secondary | ICD-10-CM | POA: Insufficient documentation

## 2020-05-22 DIAGNOSIS — C3411 Malignant neoplasm of upper lobe, right bronchus or lung: Secondary | ICD-10-CM | POA: Insufficient documentation

## 2020-05-22 DIAGNOSIS — Z923 Personal history of irradiation: Secondary | ICD-10-CM | POA: Diagnosis not present

## 2020-05-22 DIAGNOSIS — Z79899 Other long term (current) drug therapy: Secondary | ICD-10-CM | POA: Insufficient documentation

## 2020-05-22 HISTORY — DX: Personal history of irradiation: Z92.3

## 2020-05-22 NOTE — Progress Notes (Signed)
PAIN: She is currently in no pain.  RESPIRATORY: Shortness of Breath  Walking. Pt is on room air. Noted no skin abnormalities. None.  SWALLOWING/DIET: Pt denies dysphagia Denies feeling of food getting stuck in throat. Appetite "comes and goes"..  OTHER: Pt complains of fatigue, weakness and poor appetite.  BP 138/77 (BP Location: Left Arm, Patient Position: Sitting)   Pulse 71   Temp (!) 96.9 F (36.1 C) (Temporal)   Resp 18   Ht 5\' 3"  (1.6 m)   Wt 107 lb 2 oz (48.6 kg)   SpO2 100%   BMI 18.98 kg/m    Wt Readings from Last 3 Encounters:  05/22/20 107 lb 2 oz (48.6 kg)  03/19/20 111 lb 8 oz (50.6 kg)  03/17/20 113 lb (51.3 kg)

## 2020-05-22 NOTE — Progress Notes (Signed)
  Radiation Oncology         [phone] ________________________________  Name: Jillian Scott MRN: [id number]  Date: 05/22/2020  DOB: 16-Jan-1936  Follow-Up Visit Note  CC: [name], MD  Garner Nash, DO    ICD-10-CM   1. Non-small cell cancer of right lung [facility])  C34.91 CT CHEST WO CONTRAST    Diagnosis: Stage I non-small cell carcinoma of the right upper lobe  Interval Since Last Radiation: One month, one week, and four days  Radiation Treatment Dates: 04/03/2020 through 04/10/2020  Site: Right lung Technique: IMRT Total Dose (Gy): 54/54 Dose per Fx (Gy): 18 Completed Fx: 3/3 Beam Energies: 6XFFF  Narrative:  The patient returns today for routine follow-up. No significant interval history since the end of treatment.   On review of systems, she reports overall feeling well.  She has occasional episodes of swallowing difficulty which she sees the ear nose and throat physician. She denies significant cough or hemoptysis.  She denies any breathing problems since her radiation therapy.                          ALLERGIES:  has No Known Allergies.  Meds: Current Outpatient Medications  Medication Sig Dispense Refill  . ARTIFICIAL TEAR SOLUTION OP Place 2 drops into both eyes daily.    Marland Kitchen aspirin EC 81 MG tablet Take 81 mg by mouth daily. Swallow whole.    . Cholecalciferol (VITAMIN D3) 10 MCG (400 UNIT) CAPS Take 400 Units by mouth daily.    Marland Kitchen levothyroxine (SYNTHROID) 50 MCG tablet Take 50 mcg by mouth daily.    . rosuvastatin (CRESTOR) 10 MG tablet Take 10 mg by mouth daily.    . vitamin B-12 (CYANOCOBALAMIN) 500 MCG tablet Take 500 mcg by mouth daily.     No current facility-administered medications for this encounter.    Physical Findings: The patient is in no acute distress. Patient is alert and oriented.  Sitting comfortably in wheelchair breathing room air  height is 5\' 3"  (1.6 m) and weight is 107 lb 2 oz (48.6 kg). Her temporal temperature is 96.9 F (36.1  C) (abnormal). Her blood pressure is 138/77 and her pulse is 71. Her respiration is 18 and oxygen saturation is 100%.   Lungs are clear to auscultation bilaterally. Heart has regular rate and rhythm. No palpable cervical, supraclavicular, or axillary adenopathy. Abdomen soft, non-tender, normal bowel sounds.   Lab Findings: Lab Results  Component Value Date   WBC 6.5 01/09/2018   HGB 13.9 01/09/2018   HCT 42.2 01/09/2018   MCV 89.6 01/09/2018   PLT 223 01/09/2018    Radiographic Findings: No results found.  Impression: Stage I non-small cell carcinoma of the right upper lobe  The patient tolerated her radiation therapy well without any significant side effects.  Plan: The patient will follow up with radiation oncology in 3 months. She will undergo a chest CT scan prior to that visit to evaluate post-treatment changes.    ____________________________________   Blair Promise, PhD, MD  This document serves as a record of services personally performed by Gery Pray, MD. It was created on his behalf by Clerance Lav, a trained medical scribe. The creation of this record is based on the scribe's personal observations and the provider's statements to them. This document has been checked and approved by the attending provider.

## 2020-05-22 NOTE — Progress Notes (Incomplete)
  Patient Name: Jillian Scott MRN: [id number] DOB: September 16, 1935 Referring Physician: June Leap Date of Service: 04/10/2020 [facility], [place]                                                        End Of Treatment Note  Diagnoses: C34.11-Malignant neoplasm of upper lobe, right bronchus or lung  Cancer Staging: Stage I non-small cell carcinoma of the right upper lobe  Intent: Curative  Radiation Treatment Dates: 04/03/2020 through 04/10/2020  Site: Right lung Technique: IMRT Total Dose (Gy): 54/54 Dose per Fx (Gy): 18 Completed Fx: 3/3 Beam Energies: 6XFFF  Narrative: The patient tolerated radiation therapy relatively well. She did report some mild dizziness. She denied cough, shortness of breath, difficulty swallowing, and skin irritation. Appetite remained fair and energy level remained stable.  Plan: The patient will follow-up with radiation oncology in one month.  ________________________________________________   Blair Promise, PhD, MD  This document serves as a record of services personally performed by Gery Pray, MD. It was created on his behalf by Clerance Lav, a trained medical scribe. The creation of this record is based on the scribe's personal observations and the provider's statements to them. This document has been checked and approved by the attending provider.

## 2020-07-23 ENCOUNTER — Emergency Department (HOSPITAL_COMMUNITY)
Admission: EM | Admit: 2020-07-23 | Discharge: 2020-07-23 | Disposition: A | Discharge status: Home / Self Care | Payer: Medicare Other | Attending: Emergency Medicine | Admitting: Emergency Medicine

## 2020-07-23 ENCOUNTER — Emergency Department (HOSPITAL_COMMUNITY): Payer: Medicare Other

## 2020-07-23 ENCOUNTER — Encounter (HOSPITAL_COMMUNITY): Payer: Self-pay | Admitting: Emergency Medicine

## 2020-07-23 DIAGNOSIS — R531 Weakness: Secondary | ICD-10-CM | POA: Diagnosis not present

## 2020-07-23 DIAGNOSIS — Z5321 Procedure and treatment not carried out due to patient leaving prior to being seen by health care provider: Secondary | ICD-10-CM | POA: Diagnosis not present

## 2020-07-23 DIAGNOSIS — C3490 Malignant neoplasm of unspecified part of unspecified bronchus or lung: Secondary | ICD-10-CM | POA: Diagnosis not present

## 2020-07-23 DIAGNOSIS — R0602 Shortness of breath: Secondary | ICD-10-CM | POA: Insufficient documentation

## 2020-07-23 DIAGNOSIS — R918 Other nonspecific abnormal finding of lung field: Secondary | ICD-10-CM | POA: Diagnosis not present

## 2020-07-23 DIAGNOSIS — R42 Dizziness and giddiness: Secondary | ICD-10-CM | POA: Insufficient documentation

## 2020-07-23 DIAGNOSIS — J449 Chronic obstructive pulmonary disease, unspecified: Secondary | ICD-10-CM | POA: Diagnosis not present

## 2020-07-23 DIAGNOSIS — J439 Emphysema, unspecified: Secondary | ICD-10-CM | POA: Diagnosis not present

## 2020-07-23 LAB — CBC WITH DIFFERENTIAL/PLATELET
Abs Immature Granulocytes: 0.01 10*3/uL (ref 0.00–0.07)
Basophils Absolute: 0.1 10*3/uL (ref 0.0–0.1)
Basophils Relative: 1 %
Eosinophils Absolute: 0 10*3/uL (ref 0.0–0.5)
Eosinophils Relative: 0 %
HCT: 40.2 % (ref 36.0–46.0)
Hemoglobin: 13 g/dL (ref 12.0–15.0)
Immature Granulocytes: 0 %
Lymphocytes Relative: 16 %
Lymphs Abs: 0.9 10*3/uL (ref 0.7–4.0)
MCH: 30 pg (ref 26.0–34.0)
MCHC: 32.3 g/dL (ref 30.0–36.0)
MCV: 92.6 fL (ref 80.0–100.0)
Monocytes Absolute: 0.5 10*3/uL (ref 0.1–1.0)
Monocytes Relative: 9 %
Neutro Abs: 4.2 10*3/uL (ref 1.7–7.7)
Neutrophils Relative %: 74 %
Platelets: 240 10*3/uL (ref 150–400)
RBC: 4.34 MIL/uL (ref 3.87–5.11)
RDW: 14.4 % (ref 11.5–15.5)
WBC: 5.7 10*3/uL (ref 4.0–10.5)
nRBC: 0 % (ref 0.0–0.2)

## 2020-07-23 LAB — COMPREHENSIVE METABOLIC PANEL
ALT: 16 U/L (ref 0–44)
AST: 22 U/L (ref 15–41)
Albumin: 3.6 g/dL (ref 3.5–5.0)
Alkaline Phosphatase: 74 U/L (ref 38–126)
Anion gap: 9 (ref 5–15)
BUN: 10 mg/dL (ref 8–23)
CO2: 28 mmol/L (ref 22–32)
Calcium: 9.2 mg/dL (ref 8.9–10.3)
Chloride: 102 mmol/L (ref 98–111)
Creatinine, Ser: 0.74 mg/dL (ref 0.44–1.00)
GFR, Estimated: >60 mL/min (ref >60)
Glucose, Bld: 98 mg/dL (ref 70–99)
Potassium: 3.9 mmol/L (ref 3.5–5.1)
Sodium: 139 mmol/L (ref 135–145)
Total Bilirubin: 0.6 mg/dL (ref 0.3–1.2)
Total Protein: 7.1 g/dL (ref 6.5–8.1)

## 2020-07-23 LAB — BRAIN NATRIURETIC PEPTIDE: B Natriuretic Peptide: 14.7 pg/mL (ref 0.0–100.0)

## 2020-07-23 NOTE — ED Notes (Signed)
Pt left AMA °

## 2020-07-23 NOTE — ED Triage Notes (Signed)
Pt here from home with c/o dizziness for a couple of weeks worse this am . Hx of vertigo

## 2020-07-23 NOTE — ED Provider Notes (Signed)
Emergency Medicine Provider Triage Evaluation Note  Jillian Scott , a 85 y.o. female  was evaluated in triage.  Pt complains of dizziness x 2 weeks. Worse this morning when she got out of bed. No head trauma or vision changes. Endorses feeling SOB today as well.    Review of Systems  Positive: Dizzy, SOB Negative: Head trauma, vision changes  Physical Exam  BP 127/76 (BP Location: Right Arm)   Pulse 89   Temp 99 F (37.2 C) (Oral)   Resp 14   SpO2 99%  Gen:   Awake, no distress   Resp:  Normal effort. Lungs CTA.  MSK:   Moves extremities without difficulty  Other:  CN 3-12 grossly in tact. Grip strength equal.   Medical Decision Making  Medically screening exam initiated at 9:37 AM.  Appropriate orders placed.  Meggan Dhaliwal was informed that the remainder of the evaluation will be completed by another provider, this initial triage assessment does not replace that evaluation, and the importance of remaining in the ED until their evaluation is complete.   Sherrill Raring, PA-C 07/23/20 [place], MD 07/24/20 [phone]

## 2020-08-08 ENCOUNTER — Telehealth: Payer: Self-pay | Admitting: *Deleted

## 2020-08-08 NOTE — Telephone Encounter (Signed)
CALLED PATIENT TO INFORM OF CT FOR 08-22-20- ARRIVAL TIME- 11:45 AM @ [facility], NO RESTRICTIONS TO TEST, PATIENT TO RECEIVE RESULTS FROM DR. KINARD ON 09-01-20, LVM FOR A RETURN CALL

## 2020-08-14 DIAGNOSIS — E785 Hyperlipidemia, unspecified: Secondary | ICD-10-CM | POA: Diagnosis not present

## 2020-08-14 DIAGNOSIS — E559 Vitamin D deficiency, unspecified: Secondary | ICD-10-CM | POA: Diagnosis not present

## 2020-08-14 DIAGNOSIS — E039 Hypothyroidism, unspecified: Secondary | ICD-10-CM | POA: Diagnosis not present

## 2020-08-14 DIAGNOSIS — Z79899 Other long term (current) drug therapy: Secondary | ICD-10-CM | POA: Diagnosis not present

## 2020-08-14 DIAGNOSIS — E538 Deficiency of other specified B group vitamins: Secondary | ICD-10-CM | POA: Diagnosis not present

## 2020-08-22 ENCOUNTER — Ambulatory Visit (HOSPITAL_COMMUNITY)
Admission: RE | Admit: 2020-08-22 | Discharge: 2020-08-22 | Disposition: A | Discharge status: Home / Self Care | Payer: Medicare Other | Source: Ambulatory Visit | Attending: Radiation Oncology | Admitting: Radiation Oncology

## 2020-08-22 ENCOUNTER — Other Ambulatory Visit: Payer: Self-pay

## 2020-08-22 DIAGNOSIS — J449 Chronic obstructive pulmonary disease, unspecified: Secondary | ICD-10-CM | POA: Diagnosis not present

## 2020-08-22 DIAGNOSIS — R634 Abnormal weight loss: Secondary | ICD-10-CM | POA: Diagnosis not present

## 2020-08-22 DIAGNOSIS — Z0000 Encounter for general adult medical examination without abnormal findings: Secondary | ICD-10-CM | POA: Diagnosis not present

## 2020-08-22 DIAGNOSIS — C3490 Malignant neoplasm of unspecified part of unspecified bronchus or lung: Secondary | ICD-10-CM | POA: Diagnosis not present

## 2020-08-22 DIAGNOSIS — C3491 Malignant neoplasm of unspecified part of right bronchus or lung: Secondary | ICD-10-CM | POA: Diagnosis not present

## 2020-08-22 DIAGNOSIS — E785 Hyperlipidemia, unspecified: Secondary | ICD-10-CM | POA: Diagnosis not present

## 2020-08-22 DIAGNOSIS — E039 Hypothyroidism, unspecified: Secondary | ICD-10-CM | POA: Diagnosis not present

## 2020-08-22 DIAGNOSIS — E538 Deficiency of other specified B group vitamins: Secondary | ICD-10-CM | POA: Diagnosis not present

## 2020-08-22 DIAGNOSIS — R911 Solitary pulmonary nodule: Secondary | ICD-10-CM | POA: Diagnosis not present

## 2020-08-22 DIAGNOSIS — I700 Atherosclerosis of aorta: Secondary | ICD-10-CM | POA: Diagnosis not present

## 2020-08-25 ENCOUNTER — Ambulatory Visit: Payer: Self-pay | Admitting: Radiation Oncology

## 2020-08-30 NOTE — Progress Notes (Signed)
Radiation Oncology         [phone] ________________________________  Name: Jillian Scott MRN: [id number]  Date: 09/01/2020  DOB: Aug 26, 1935  Follow-Up Visit Note  CC: [name], MD  Garner Nash, DO    ICD-10-CM   1. Non-small cell cancer of right lung [facility])  C34.91 CT CHEST WO CONTRAST      Diagnosis: Stage I non-small cell carcinoma of the right upper lobe  Interval Since Last Radiation: 4 months and 22 days  Intent: Curative   Radiation Treatment Dates: 04/03/2020 through 04/10/2020   Site: Right lung Technique: IMRT Total Dose (Gy): 54/54 Dose per Fx (Gy): 18 Completed Fx: 3/3 Beam Energies: 6XFFF  Narrative:  The patient returns today for routine follow-up, she was last seen here on 05/22/20.       Of note: the patient presented to the [facility] ED on 07/23/20 with dizziness for two weeks; noted to be worse in the morning when she gets out of bed. The patient additionally endorsed feeling short of breath on the day of her ED encounter. Chest x-ray showed no concerning findings relevant to patients lung cancer ( showed decrease in size of right upper lobe nodule). Head CT was additionally taken which showed no acute findings. Patient noted to have left AMA.  Chest CT taken on 08/22/20 demonstrated no interval change in the right upper lobe pulmonary nodule. No new nodularity within the lungs or mediastinal adenopathy was seen.                       Allergies:  has No Known Allergies.  Meds: Current Outpatient Medications  Medication Sig Dispense Refill   albuterol (VENTOLIN HFA) 108 (90 Base) MCG/ACT inhaler Inhale 2 puffs into the lungs every 6 (six) hours as needed for wheezing or shortness of breath.     ARTIFICIAL TEAR SOLUTION OP Place 2 drops into both eyes daily.     aspirin EC 81 MG tablet Take 81 mg by mouth daily. Swallow whole.     Cholecalciferol (VITAMIN D3) 10 MCG (400 UNIT) CAPS Take 400 Units by mouth daily.     levothyroxine (SYNTHROID) 50  MCG tablet Take 50 mcg by mouth daily.     rosuvastatin (CRESTOR) 10 MG tablet Take 10 mg by mouth daily.     vitamin B-12 (CYANOCOBALAMIN) 500 MCG tablet Take 500 mcg by mouth daily.     No current facility-administered medications for this encounter.    Physical Findings: The patient is in no acute distress. Patient is alert and oriented.  height is 5\' 3"  (1.6 m) and weight is 99 lb 6.4 oz (45.1 kg). Her temperature is 97.9 F (36.6 C). Her blood pressure is 123/85 and her pulse is 81. Her respiration is 18 and oxygen saturation is 100%. .  No significant changes. Lungs are clear to auscultation bilaterally. Heart has regular rate and rhythm. No palpable cervical, supraclavicular, or axillary adenopathy. Abdomen soft, non-tender, normal bowel sounds.    Lab Findings: Lab Results  Component Value Date   WBC 5.7 07/23/2020   HGB 13.0 07/23/2020   HCT 40.2 07/23/2020   MCV 92.6 07/23/2020   PLT 240 07/23/2020    Radiographic Findings: CT CHEST WO CONTRAST  Result Date: 08/25/2020 CLINICAL DATA:  Non-small cell lung cancer. Assess treatment response. EXAM: CT CHEST WITHOUT CONTRAST TECHNIQUE: Multidetector CT imaging of the chest was performed following the standard protocol without IV contrast. COMPARISON:  CT 02/28/2020 FINDINGS: Cardiovascular:  Coronary artery calcification and aortic atherosclerotic calcification. Mediastinum/Nodes: No axillary or supraclavicular adenopathy. No mediastinal or hilar adenopathy. No pericardial fluid. Esophagus normal. Lungs/Pleura: RIGHT upper lobe nodule measures 1.3 x 1.1 cm compared to 1.4 x 1.1 cm. Fiducial markers adjacent to nodule. Biapical pleuroparenchymal thickening is unchanged. Upper Abdomen: Limited view of the liver, kidneys, pancreas are unremarkable. Normal adrenal glands. Musculoskeletal: No aggressive osseous lesion. IMPRESSION: 1. No interval change in RIGHT upper lobe pulmonary nodule adjacent to fiducial markers. 2. No new nodularity  within the lungs. 3. No mediastinal adenopathy. 4. Coronary artery calcification and Aortic Atherosclerosis (ICD10-I70.0). Electronically Signed   By: Suzy Bouchard M.D.   On: 08/25/2020 11:52    Impression: Stage I non-small cell carcinoma of the right upper lobe  No concerning findings on clinical exam today.  Recent chest CT scan shows no new areas..  Treated area is stable.  Plan: Routine follow-up in 6 months.  Prior to this follow-up the patient will undergo a CT scan of the chest to follow-up on her SBRT treatment.   20 minutes of total time was spent for this patient encounter, including preparation, face-to-face counseling with the patient and coordination of care, physical exam, and documentation of the encounter. ____________________________________  Blair Promise, PhD, MD  This document serves as a record of services personally performed by Gery Pray, MD. It was created on his behalf by Roney Mans, a trained medical scribe. The creation of this record is based on the scribe's personal observations and the provider's statements to them. This document has been checked and approved by the attending provider.

## 2020-09-01 ENCOUNTER — Other Ambulatory Visit: Payer: Self-pay

## 2020-09-01 ENCOUNTER — Ambulatory Visit
Admission: RE | Admit: 2020-09-01 | Discharge: 2020-09-01 | Disposition: A | Discharge status: Home / Self Care | Payer: Medicare Other | Source: Ambulatory Visit | Attending: Radiation Oncology | Admitting: Radiation Oncology

## 2020-09-01 ENCOUNTER — Encounter: Payer: Self-pay | Admitting: Radiation Oncology

## 2020-09-01 DIAGNOSIS — Z923 Personal history of irradiation: Secondary | ICD-10-CM | POA: Diagnosis not present

## 2020-09-01 DIAGNOSIS — I700 Atherosclerosis of aorta: Secondary | ICD-10-CM | POA: Insufficient documentation

## 2020-09-01 DIAGNOSIS — Z7982 Long term (current) use of aspirin: Secondary | ICD-10-CM | POA: Diagnosis not present

## 2020-09-01 DIAGNOSIS — Z79899 Other long term (current) drug therapy: Secondary | ICD-10-CM | POA: Diagnosis not present

## 2020-09-01 DIAGNOSIS — C3411 Malignant neoplasm of upper lobe, right bronchus or lung: Secondary | ICD-10-CM | POA: Diagnosis not present

## 2020-09-01 DIAGNOSIS — C3491 Malignant neoplasm of unspecified part of right bronchus or lung: Secondary | ICD-10-CM

## 2020-09-01 DIAGNOSIS — Z08 Encounter for follow-up examination after completed treatment for malignant neoplasm: Secondary | ICD-10-CM | POA: Diagnosis not present

## 2020-09-01 NOTE — Progress Notes (Signed)
Jillian Scott is here today for follow up post radiation to the lung.  Lung Side: right, patient completed radiation treatment on 04/10/20  Does the patient complain of any of the following: Pain:No Shortness of breath w/wo exertion: yes Cough: no  Hemoptysis: no Pain with swallowing: no Swallowing/choking concerns: no Appetite: Patient reports having a poor appetite, encouraged patient to try ensure or boost. Patient voiced understanding.  Energy Level: continues to have mild fatigue Post radiation skin Changes: no    Additional comments if applicable:  Vitals:   19/01/22 1104  BP: 123/85  Pulse: 81  Resp: 18  Temp: 97.9 F (36.6 C)  SpO2: 100%  Weight: 99 lb 6.4 oz (45.1 kg)  Height: 5\' 3"  (1.6 m)

## 2020-10-20 DIAGNOSIS — J310 Chronic rhinitis: Secondary | ICD-10-CM | POA: Diagnosis not present

## 2020-10-20 DIAGNOSIS — J343 Hypertrophy of nasal turbinates: Secondary | ICD-10-CM | POA: Diagnosis not present

## 2020-10-20 DIAGNOSIS — J320 Chronic maxillary sinusitis: Secondary | ICD-10-CM | POA: Diagnosis not present

## 2020-11-18 DIAGNOSIS — H25812 Combined forms of age-related cataract, left eye: Secondary | ICD-10-CM | POA: Diagnosis not present

## 2020-11-18 DIAGNOSIS — Z01818 Encounter for other preprocedural examination: Secondary | ICD-10-CM | POA: Diagnosis not present

## 2020-11-18 DIAGNOSIS — H25813 Combined forms of age-related cataract, bilateral: Secondary | ICD-10-CM | POA: Diagnosis not present

## 2020-11-18 DIAGNOSIS — H40003 Preglaucoma, unspecified, bilateral: Secondary | ICD-10-CM | POA: Diagnosis not present

## 2020-12-01 DIAGNOSIS — H25812 Combined forms of age-related cataract, left eye: Secondary | ICD-10-CM | POA: Diagnosis not present

## 2020-12-15 DIAGNOSIS — H25811 Combined forms of age-related cataract, right eye: Secondary | ICD-10-CM | POA: Diagnosis not present

## 2020-12-15 DIAGNOSIS — H2511 Age-related nuclear cataract, right eye: Secondary | ICD-10-CM | POA: Diagnosis not present

## 2021-03-04 ENCOUNTER — Ambulatory Visit (HOSPITAL_COMMUNITY)
Admission: RE | Admit: 2021-03-04 | Discharge: 2021-03-04 | Disposition: A | Discharge status: Home / Self Care | Payer: Medicare Other | Source: Ambulatory Visit | Attending: Radiation Oncology | Admitting: Radiation Oncology

## 2021-03-04 ENCOUNTER — Other Ambulatory Visit: Payer: Self-pay

## 2021-03-04 DIAGNOSIS — C3491 Malignant neoplasm of unspecified part of right bronchus or lung: Secondary | ICD-10-CM | POA: Insufficient documentation

## 2021-03-06 NOTE — Progress Notes (Signed)
Jillian Scott is here today for follow up post radiation to the lung.  Lung Side: right  Completed radiation treatment on: 04/10/2020  Does the patient complain of any of the following: Pain: Patient denies pain.  Shortness of breath w/wo exertion: Continues to have shortness of breath on exertion.  Cough: no Hemoptysis: no Pain with swallowing: no Swallowing/choking concerns: no Appetite: Fair,encouraged patient to try ensure or boost. Patient voiced understanding.  Wt Readings from Last 3 Encounters:  03/09/21 97 lb 6.4 oz (44.2 kg)  09/01/20 99 lb 6.4 oz (45.1 kg)  05/22/20 107 lb 2 oz (48.6 kg)    Energy Level: Patient reports some days she has more energy than others.  Post radiation skin Changes: intact    Additional comments if applicable:    Vitals:   03/09/21 1058  BP: 112/61  Pulse: 86  Resp: 20  Temp: 97.6 F (36.4 C)  SpO2: 98%  Weight: 97 lb 6.4 oz (44.2 kg)  Height: 5\' 3"  (1.6 m)

## 2021-03-08 NOTE — Progress Notes (Signed)
Radiation Oncology         [phone] ________________________________  Name: Jillian Scott MRN: [id number]  Date: 03/09/2021  DOB: 1935/10/09  Follow-Up Visit Note  CC: [name], MD  Garner Nash, DO    ICD-10-CM   1. Non-small cell cancer of right lung [facility])  C34.91 CT CHEST WO CONTRAST      Diagnosis: Stage I non-small cell carcinoma of the right upper lobe   Interval Since Last Radiation: 10 months and 26 days   Intent: Curative   Radiation Treatment Dates: 04/03/2020 through 04/10/2020   Site: Right lung Technique: SBRT Total Dose (Gy): 54/54 Dose per Fx (Gy): 18 Completed Fx: 3/3 Beam Energies: 6XFFF  Narrative:  The patient returns today for routine follow-up and to review recent imaging, she was last seen here for follow-up on 09/01/20.   Her most recent chest CT on 03/04/20 showed the cavitary right upper lobe nodule to appear stable in the interval, with increased surrounding radiation changes. No evidence of metastatic disease was otherwise appreciated.   Otherwise, no significant interval history since the patient was last seen.  She reports chronic poor appetite.  She denies any pain within the chest area significant cough or hemoptysis.  She does have an inhaler that she occasionally will use.  She is inquiring about whether she needed oxygen and I have recommended she discuss this with her primary care physician or Dr. Valeta Harms.  Her oxygen saturation is 98% on room air during this visit.  She denies any new bony pain headaches dizziness or blurred vision.   Allergies:  has No Known Allergies.  Meds: Current Outpatient Medications  Medication Sig Dispense Refill   albuterol (VENTOLIN HFA) 108 (90 Base) MCG/ACT inhaler Inhale 2 puffs into the lungs every 6 (six) hours as needed for wheezing or shortness of breath.     ARTIFICIAL TEAR SOLUTION OP Place 2 drops into both eyes daily.     aspirin EC 81 MG tablet Take 81 mg by mouth daily. Swallow whole.      Cholecalciferol (VITAMIN D3) 10 MCG (400 UNIT) CAPS Take 400 Units by mouth daily.     levothyroxine (SYNTHROID) 50 MCG tablet Take 50 mcg by mouth daily.     rosuvastatin (CRESTOR) 10 MG tablet Take 10 mg by mouth daily.     vitamin B-12 (CYANOCOBALAMIN) 500 MCG tablet Take 500 mcg by mouth daily.     No current facility-administered medications for this encounter.    Physical Findings: The patient is in no acute distress. Patient is alert and oriented.  height is 5\' 3"  (1.6 m) and weight is 97 lb 6.4 oz (44.2 kg). Her temperature is 97.6 F (36.4 C). Her blood pressure is 112/61 and her pulse is 86. Her respiration is 20 and oxygen saturation is 98%. .  No significant changes. Lungs are clear to auscultation bilaterally. Heart has regular rate and rhythm. No palpable cervical, supraclavicular, or axillary adenopathy. Abdomen soft, non-tender, normal bowel sounds.    Lab Findings: Lab Results  Component Value Date   WBC 5.7 07/23/2020   HGB 13.0 07/23/2020   HCT 40.2 07/23/2020   MCV 92.6 07/23/2020   PLT 240 07/23/2020    Radiographic Findings:   Impression:  Stage I non-small cell carcinoma of the right upper lobe   No evidence of recurrence on clinical exam today.  Recent chest CT scan favorable.   Plan: Routine follow-up in 6 months.  Prior to this follow-up appointment the patient  will have a repeat chest CT scan.   20 minutes of total time was spent for this patient encounter, including preparation, face-to-face counseling with the patient and coordination of care, physical exam, and documentation of the encounter. ____________________________________  Blair Promise, PhD, MD   This document serves as a record of services personally performed by Gery Pray, MD. It was created on his behalf by Roney Mans, a trained medical scribe. The creation of this record is based on the scribe's personal observations and the provider's statements to them. This document has  been checked and approved by the attending provider.

## 2021-03-09 ENCOUNTER — Ambulatory Visit
Admission: RE | Admit: 2021-03-09 | Discharge: 2021-03-09 | Disposition: A | Discharge status: Home / Self Care | Payer: Medicare Other | Source: Ambulatory Visit | Attending: Radiation Oncology | Admitting: Radiation Oncology

## 2021-03-09 ENCOUNTER — Other Ambulatory Visit: Payer: Self-pay

## 2021-03-09 ENCOUNTER — Encounter: Payer: Self-pay | Admitting: Radiation Oncology

## 2021-03-09 VITALS — BP 112/61 | HR 86 | Temp 97.6°F | Resp 20 | Ht 63.0 in | Wt 97.4 lb

## 2021-03-09 DIAGNOSIS — Z7982 Long term (current) use of aspirin: Secondary | ICD-10-CM | POA: Diagnosis not present

## 2021-03-09 DIAGNOSIS — C3491 Malignant neoplasm of unspecified part of right bronchus or lung: Secondary | ICD-10-CM

## 2021-03-09 DIAGNOSIS — R630 Anorexia: Secondary | ICD-10-CM | POA: Diagnosis not present

## 2021-03-09 DIAGNOSIS — Z79899 Other long term (current) drug therapy: Secondary | ICD-10-CM | POA: Insufficient documentation

## 2021-03-09 DIAGNOSIS — Z85118 Personal history of other malignant neoplasm of bronchus and lung: Secondary | ICD-10-CM | POA: Diagnosis not present

## 2021-03-09 DIAGNOSIS — Z923 Personal history of irradiation: Secondary | ICD-10-CM | POA: Diagnosis not present

## 2021-04-20 ENCOUNTER — Other Ambulatory Visit: Payer: Self-pay | Admitting: Otolaryngology

## 2021-04-20 DIAGNOSIS — D140 Benign neoplasm of middle ear, nasal cavity and accessory sinuses: Secondary | ICD-10-CM

## 2021-05-07 ENCOUNTER — Ambulatory Visit
Admission: RE | Admit: 2021-05-07 | Discharge: 2021-05-07 | Disposition: A | Discharge status: Home / Self Care | Payer: Medicare Other | Source: Ambulatory Visit | Attending: Otolaryngology | Admitting: Otolaryngology

## 2021-05-07 DIAGNOSIS — D140 Benign neoplasm of middle ear, nasal cavity and accessory sinuses: Secondary | ICD-10-CM

## 2021-05-25 ENCOUNTER — Other Ambulatory Visit: Payer: Self-pay | Admitting: Otolaryngology

## 2021-06-08 ENCOUNTER — Encounter (HOSPITAL_BASED_OUTPATIENT_CLINIC_OR_DEPARTMENT_OTHER): Payer: Self-pay

## 2021-06-08 ENCOUNTER — Ambulatory Visit (HOSPITAL_BASED_OUTPATIENT_CLINIC_OR_DEPARTMENT_OTHER): Admit: 2021-06-08 | Payer: Medicare Other | Admitting: Otolaryngology

## 2021-06-08 SURGERY — MAXILLARY ANTROSTOMY
Anesthesia: General | Laterality: Right

## 2021-07-03 ENCOUNTER — Other Ambulatory Visit: Payer: Self-pay

## 2021-07-03 ENCOUNTER — Encounter (HOSPITAL_COMMUNITY): Payer: Self-pay

## 2021-07-03 ENCOUNTER — Encounter (HOSPITAL_COMMUNITY)
Admission: RE | Admit: 2021-07-03 | Discharge: 2021-07-03 | Disposition: A | Discharge status: Home / Self Care | Payer: Medicare Other | Source: Ambulatory Visit | Attending: Otolaryngology | Admitting: Otolaryngology

## 2021-07-03 VITALS — BP 119/70 | HR 84 | Temp 98.8°F | Resp 16 | Ht 62.0 in | Wt 92.3 lb

## 2021-07-03 DIAGNOSIS — Z01812 Encounter for preprocedural laboratory examination: Secondary | ICD-10-CM | POA: Diagnosis present

## 2021-07-03 DIAGNOSIS — Z01818 Encounter for other preprocedural examination: Secondary | ICD-10-CM

## 2021-07-03 HISTORY — DX: Chronic obstructive pulmonary disease, unspecified: J44.9

## 2021-07-03 LAB — CBC
HCT: 39.5 % (ref 36.0–46.0)
Hemoglobin: 12.7 g/dL (ref 12.0–15.0)
MCH: 29.7 pg (ref 26.0–34.0)
MCHC: 32.2 g/dL (ref 30.0–36.0)
MCV: 92.5 fL (ref 80.0–100.0)
Platelets: 238 10*3/uL (ref 150–400)
RBC: 4.27 MIL/uL (ref 3.87–5.11)
RDW: 14.4 % (ref 11.5–15.5)
WBC: 4.9 10*3/uL (ref 4.0–10.5)
nRBC: 0 % (ref 0.0–0.2)

## 2021-07-03 NOTE — Pre-Procedure Instructions (Signed)
Surgical Instructions    Your procedure is scheduled on July 17, 2021.  Report to [facility] Main Entrance "A" at 11:30 A.M., then check in with the Admitting office.  Call this number if you have problems the morning of surgery:  [phone]   If you have any questions prior to your surgery date call [phone]: Open Monday-Friday 8am-4pm    Remember:  Do not eat or drink after midnight the night before your surgery    Take these medicines the morning of surgery with A SIP OF WATER:  ARTIFICIAL TEAR SOLUTION   levothyroxine (SYNTHROID)   rosuvastatin (CRESTOR)   Take these medicines the morning of surgery AS NEEDED:  acetaminophen (TYLENOL)  albuterol (VENTOLIN HFA) - please bring inhaler with you to hospital  Follow your surgeon's instructions on when to stop Aspirin.  If no instructions were given by your surgeon then you will need to call the office to get those instructions.       As of today, STOP taking any Aleve, Naproxen, Ibuprofen, Motrin, Advil, Goody's, BC's, all herbal medications, fish oil, and all vitamins.                     Do NOT Smoke (Tobacco/Vaping) for 24 hours prior to your procedure.  If you use a CPAP at night, you may bring your mask/headgear for your overnight stay.   Contacts, glasses, piercing's, hearing aid's, dentures or partials may not be worn into surgery, please bring cases for these belongings.    For patients admitted to the hospital, discharge time will be determined by your treatment team.   Patients discharged the day of surgery will not be allowed to drive home, and someone needs to stay with them for 24 hours.  SURGICAL WAITING ROOM VISITATION Patients having surgery or a procedure may have two support people in the waiting room. These visitors may be switched out with other visitors if needed. Children under the age of 85 must have an adult accompany them who is not the patient. If the patient needs to stay at the hospital  during part of their recovery, the visitor guidelines for inpatient rooms apply.  Please refer to the [facility] website for the visitor guidelines for Inpatients (after your surgery is over and you are in a regular room).    Special instructions:   [place]- Preparing For Surgery  Before surgery, you can play an important role. Because skin is not sterile, your skin needs to be as free of germs as possible. You can reduce the number of germs on your skin by washing with CHG (chlorahexidine gluconate) Soap before surgery.  CHG is an antiseptic cleaner which kills germs and bonds with the skin to continue killing germs even after washing.    Oral Hygiene is also important to reduce your risk of infection.  Remember - BRUSH YOUR TEETH THE MORNING OF SURGERY WITH YOUR REGULAR TOOTHPASTE  Please do not use if you have an allergy to CHG or antibacterial soaps. If your skin becomes reddened/irritated stop using the CHG.  Do not shave (including legs and underarms) for at least 48 hours prior to first CHG shower. It is OK to shave your face.  Please follow these instructions carefully.   Shower the NIGHT BEFORE SURGERY and the MORNING OF SURGERY  If you chose to wash your hair, wash your hair first as usual with your normal shampoo.  After you shampoo, rinse your hair and body thoroughly to remove  the shampoo.  Use CHG Soap as you would any other liquid soap. You can apply CHG directly to the skin and wash gently with a scrungie or a clean washcloth.   Apply the CHG Soap to your body ONLY FROM THE NECK DOWN.  Do not use on open wounds or open sores. Avoid contact with your eyes, ears, mouth and genitals (private parts). Wash Face and genitals (private parts)  with your normal soap.   Wash thoroughly, paying special attention to the area where your surgery will be performed.  Thoroughly rinse your body with warm water from the neck down.  DO NOT shower/wash with your normal soap after using  and rinsing off the CHG Soap.  Pat yourself dry with a CLEAN TOWEL.  Wear CLEAN PAJAMAS to bed the night before surgery  Place CLEAN SHEETS on your bed the night before your surgery  DO NOT SLEEP WITH PETS.   Day of Surgery:  Take a shower with CHG soap.  Do not wear jewelry or makeup Do not wear lotions, powders, perfumes/colognes, or deodorant. Do not shave 48 hours prior to surgery.   Do not bring valuables to the hospital. [facility] is not responsible for any belongings or valuables. Do not wear nail polish, gel polish, artificial nails, or any other type of covering on natural nails (fingers and toes) If you have artificial nails or gel coating that need to be removed by a nail salon, please have this removed prior to surgery. Artificial nails or gel coating may interfere with anesthesia's ability to adequately monitor your vital signs  Wear Clean/Comfortable clothing the morning of surgery  Remember to brush your teeth WITH YOUR REGULAR TOOTHPASTE.   Please read over the following fact sheets that you were given.    If you received a COVID test during your pre-op visit  it is requested that you wear a mask when out in public, stay away from anyone that may not be feeling well and notify your surgeon if you develop symptoms. If you have been in contact with anyone that has tested positive in the last 10 days please notify you surgeon.

## 2021-07-03 NOTE — Progress Notes (Signed)
PCP - [name], DO Cardiologist - Denies  PPM/ICD - Denies Device Orders - n/a Rep Notified - n/a  Chest x-ray - n/a EKG - 07/24/2020 Stress Test - 04/22/2015 ECHO - 05/02/2015 Cardiac Cath - denies  Sleep Study - denies CPAP - n/a  No DM  Blood Thinner Instructions: n/a Aspirin Instructions: Dr. Benjamine Mola Office Number given to patient, patients son, and patients niece to follow-up today on ASA instructions.  NPO after midnight  COVID TEST- n/a   Anesthesia review: Yes. Discussed case with Karoline Caldwell, PA-C during PAT visit. Had ECHO done in 2017 for heart murmur. Per son, no additional testing or follow-up was done after ECHO completed.  Patient denies shortness of breath, fever, cough and chest pain at PAT appointment   All instructions explained to the patient, with a verbal understanding of the material. Patient agrees to go over the instructions while at home for a better understanding. Patient also instructed to self quarantine after being tested for COVID-19. The opportunity to ask questions was provided.

## 2021-07-17 ENCOUNTER — Inpatient Hospital Stay (HOSPITAL_COMMUNITY): Payer: Medicare Other | Admitting: Physician Assistant

## 2021-07-17 ENCOUNTER — Ambulatory Visit (HOSPITAL_COMMUNITY)
Admission: RE | Admit: 2021-07-17 | Discharge: 2021-07-17 | Disposition: A | Discharge status: Home / Self Care | Payer: Medicare Other | Source: Ambulatory Visit | Attending: Otolaryngology | Admitting: Otolaryngology

## 2021-07-17 ENCOUNTER — Other Ambulatory Visit: Payer: Self-pay

## 2021-07-17 ENCOUNTER — Encounter (HOSPITAL_COMMUNITY): Payer: Self-pay | Admitting: Otolaryngology

## 2021-07-17 ENCOUNTER — Inpatient Hospital Stay (HOSPITAL_COMMUNITY): Payer: Medicare Other | Admitting: Anesthesiology

## 2021-07-17 ENCOUNTER — Encounter (HOSPITAL_COMMUNITY)
Admission: RE | Disposition: A | Discharge status: Home / Self Care | Payer: Self-pay | Source: Ambulatory Visit | Attending: Otolaryngology

## 2021-07-17 DIAGNOSIS — D140 Benign neoplasm of middle ear, nasal cavity and accessory sinuses: Secondary | ICD-10-CM | POA: Insufficient documentation

## 2021-07-17 DIAGNOSIS — J449 Chronic obstructive pulmonary disease, unspecified: Secondary | ICD-10-CM | POA: Diagnosis not present

## 2021-07-17 DIAGNOSIS — E039 Hypothyroidism, unspecified: Secondary | ICD-10-CM | POA: Insufficient documentation

## 2021-07-17 DIAGNOSIS — D164 Benign neoplasm of bones of skull and face: Secondary | ICD-10-CM | POA: Diagnosis not present

## 2021-07-17 HISTORY — PX: MAXILLECTOMY: SHX5207

## 2021-07-17 LAB — GLUCOSE, CAPILLARY: Glucose-Capillary: 73 mg/dL (ref 70–99)

## 2021-07-17 SURGERY — MAXILLECTOMY
Anesthesia: General | Laterality: Right

## 2021-07-17 MED ORDER — SUGAMMADEX SODIUM 200 MG/2ML IV SOLN
INTRAVENOUS | Status: DC | PRN
  Administered 2021-07-17: 200 mg via INTRAVENOUS

## 2021-07-17 MED ORDER — ONDANSETRON HCL 4 MG/2ML IJ SOLN
INTRAMUSCULAR | Status: DC | PRN
  Administered 2021-07-17: 4 mg via INTRAVENOUS

## 2021-07-17 MED ORDER — ORAL CARE MOUTH RINSE: 15.0000 mL | Freq: Once | OROMUCOSAL | Status: AC

## 2021-07-17 MED ORDER — LIDOCAINE-EPINEPHRINE 1 %-1:100000 IJ SOLN
INTRAMUSCULAR | Status: DC | PRN
  Administered 2021-07-17: 3 mL

## 2021-07-17 MED ORDER — OXYMETAZOLINE HCL 0.05 % NA SOLN
NASAL | Status: AC
  Filled 2021-07-17: qty 30

## 2021-07-17 MED ORDER — LACTATED RINGERS IV SOLN: INTRAVENOUS | Status: DC

## 2021-07-17 MED ORDER — LIDOCAINE-EPINEPHRINE 1 %-1:100000 IJ SOLN
INTRAMUSCULAR | Status: AC
  Filled 2021-07-17: qty 1

## 2021-07-17 MED ORDER — PHENYLEPHRINE 80 MCG/ML (10ML) SYRINGE FOR IV PUSH (FOR BLOOD PRESSURE SUPPORT)
PREFILLED_SYRINGE | INTRAVENOUS | Status: DC | PRN
  Administered 2021-07-17 (×2): 80 ug via INTRAVENOUS
  Administered 2021-07-17: 160 ug via INTRAVENOUS
  Administered 2021-07-17 (×3): 80 ug via INTRAVENOUS

## 2021-07-17 MED ORDER — AMOXICILLIN 875 MG PO TABS: 875.0000 mg | ORAL_TABLET | Freq: Two times a day (BID) | ORAL | 0 refills | Status: AC

## 2021-07-17 MED ORDER — ONDANSETRON HCL 4 MG/2ML IJ SOLN: 4.0000 mg | Freq: Once | INTRAMUSCULAR | Status: DC | PRN

## 2021-07-17 MED ORDER — 0.9 % SODIUM CHLORIDE (POUR BTL) OPTIME
TOPICAL | Status: DC | PRN
  Administered 2021-07-17: 1000 mL

## 2021-07-17 MED ORDER — DEXAMETHASONE SODIUM PHOSPHATE 10 MG/ML IJ SOLN
INTRAMUSCULAR | Status: DC | PRN
  Administered 2021-07-17: 10 mg via INTRAVENOUS

## 2021-07-17 MED ORDER — CHLORHEXIDINE GLUCONATE 0.12 % MT SOLN: 15.0000 mL | Freq: Once | OROMUCOSAL | Status: AC

## 2021-07-17 MED ORDER — CHLORHEXIDINE GLUCONATE 0.12 % MT SOLN
OROMUCOSAL | Status: AC
  Administered 2021-07-17: 15 mL via OROMUCOSAL
  Filled 2021-07-17: qty 15

## 2021-07-17 MED ORDER — FENTANYL CITRATE (PF) 250 MCG/5ML IJ SOLN
INTRAMUSCULAR | Status: DC | PRN
  Administered 2021-07-17: 100 ug via INTRAVENOUS

## 2021-07-17 MED ORDER — ACETAMINOPHEN 10 MG/ML IV SOLN: 750.0000 mg | Freq: Once | INTRAVENOUS | Status: DC | PRN

## 2021-07-17 MED ORDER — PROPOFOL 10 MG/ML IV BOLUS
INTRAVENOUS | Status: DC | PRN
  Administered 2021-07-17: 100 mg via INTRAVENOUS

## 2021-07-17 MED ORDER — CEFAZOLIN SODIUM-DEXTROSE 2-3 GM-%(50ML) IV SOLR
INTRAVENOUS | Status: DC | PRN
  Administered 2021-07-17: 2 g via INTRAVENOUS

## 2021-07-17 MED ORDER — ROCURONIUM BROMIDE 10 MG/ML (PF) SYRINGE
PREFILLED_SYRINGE | INTRAVENOUS | Status: DC | PRN
  Administered 2021-07-17: 50 mg via INTRAVENOUS

## 2021-07-17 MED ORDER — PROPOFOL 10 MG/ML IV BOLUS
INTRAVENOUS | Status: AC
  Filled 2021-07-17: qty 20

## 2021-07-17 MED ORDER — FENTANYL CITRATE (PF) 250 MCG/5ML IJ SOLN
INTRAMUSCULAR | Status: AC
  Filled 2021-07-17: qty 5

## 2021-07-17 MED ORDER — AMISULPRIDE (ANTIEMETIC) 5 MG/2ML IV SOLN: 10.0000 mg | Freq: Once | INTRAVENOUS | Status: DC | PRN

## 2021-07-17 MED ORDER — FENTANYL CITRATE (PF) 100 MCG/2ML IJ SOLN: 25.0000 ug | INTRAMUSCULAR | Status: DC | PRN

## 2021-07-17 MED ORDER — LIDOCAINE 2% (20 MG/ML) 5 ML SYRINGE
INTRAMUSCULAR | Status: DC | PRN
  Administered 2021-07-17: 60 mg via INTRAVENOUS

## 2021-07-17 MED ORDER — CEFAZOLIN SODIUM 1 G IJ SOLR
INTRAMUSCULAR | Status: AC
  Filled 2021-07-17: qty 30

## 2021-07-17 SURGICAL SUPPLY — 24 items
BAG COUNTER SPONGE SURGICOUNT (BAG) ×3 IMPLANT
BAG SPNG CNTER NS LX DISP (BAG) ×1
BLADE SURG 15 STRL LF DISP TIS (BLADE) IMPLANT
BLADE SURG 15 STRL SS (BLADE)
CANISTER SUCT 3000ML PPV (MISCELLANEOUS) ×3 IMPLANT
CLEANER TIP ELECTROSURG 2X2 (MISCELLANEOUS) ×3 IMPLANT
COAGULATOR SUCT SWTCH 10FR 6 (ELECTROSURGICAL) ×1 IMPLANT
ELECT COATED BLADE 2.86 ST (ELECTRODE) ×1 IMPLANT
ELECT REM PT RETURN 9FT ADLT (ELECTROSURGICAL) ×2
ELECTRODE REM PT RTRN 9FT ADLT (ELECTROSURGICAL) ×2 IMPLANT
GLOVE ECLIPSE 7.5 STRL STRAW (GLOVE) ×3 IMPLANT
GOWN STRL REUS W/ TWL LRG LVL3 (GOWN DISPOSABLE) ×4 IMPLANT
GOWN STRL REUS W/TWL LRG LVL3 (GOWN DISPOSABLE) ×4
KIT BASIN OR (CUSTOM PROCEDURE TRAY) ×3 IMPLANT
KIT TURNOVER KIT B (KITS) ×3 IMPLANT
NDL HYPO 25GX1X1/2 BEV (NEEDLE) IMPLANT
NEEDLE HYPO 25GX1X1/2 BEV (NEEDLE) ×2 IMPLANT
NS IRRIG 1000ML POUR BTL (IV SOLUTION) ×3 IMPLANT
PAD ARMBOARD 7.5X6 YLW CONV (MISCELLANEOUS) ×6 IMPLANT
PENCIL SMOKE EVACUATOR (MISCELLANEOUS) ×3 IMPLANT
SUT VIC AB 4-0 PS2 27 (SUTURE) ×1 IMPLANT
TOWEL GREEN STERILE FF (TOWEL DISPOSABLE) ×3 IMPLANT
TRAY ENT MC OR (CUSTOM PROCEDURE TRAY) ×3 IMPLANT
TUBE CONNECTING 12X1/4 (SUCTIONS) ×1 IMPLANT

## 2021-07-17 NOTE — Anesthesia Procedure Notes (Signed)
Procedure Name: Intubation Date/Time: 07/17/2021 1:45 PM  Performed by: Vonna Drafts, CRNAPre-anesthesia Checklist: Patient identified, Emergency Drugs available, Suction available and Patient being monitored Patient Re-evaluated:Patient Re-evaluated prior to induction Oxygen Delivery Method: Circle system utilized Preoxygenation: Pre-oxygenation with 100% oxygen Induction Type: IV induction Ventilation: Mask ventilation without difficulty Laryngoscope Size: Mac and 3 Grade View: Grade I Tube type: Oral Tube size: 7.0 mm Number of attempts: 1 Airway Equipment and Method: Stylet and Oral airway Placement Confirmation: ETT inserted through vocal cords under direct vision, positive ETCO2 and breath sounds checked- equal and bilateral Secured at: 21 cm Tube secured with: Tape Dental Injury: Teeth and Oropharynx as per pre-operative assessment

## 2021-07-17 NOTE — Op Note (Signed)
DATE OF PROCEDURE:  07/17/2021                              OPERATIVE REPORT  SURGEON:  Leta Baptist, MD  PREOPERATIVE DIAGNOSES: 1. Recurrent right maxillary inverted papilloma  POSTOPERATIVE DIAGNOSES: 1. Recurrent right maxillary inverted papilloma  PROCEDURE PERFORMED: Right Caldwell-luc maxillectomy with excision of recurrent inverted papilloma  ANESTHESIA:  General endotracheal tube anesthesia.  COMPLICATIONS:  None.  ESTIMATED BLOOD LOSS: 10 mL  INDICATION FOR PROCEDURE:  Bryla Burek is a 86 y.o. female with a history of right maxillary inverted papilloma.  She underwent right endoscopic sinus surgery and removal of the papilloma in 2020.  According to the patient, she has noted increasing discomfort and pressure sensation in her right midface. Recurrent polypoid tissue was noted within the right maxillary sinus.  She subsequently underwent a sinus CT scan.  The CT showed a 2 x 1 cm soft tissue mass within the right maxillary sinus.  The findings were concerning for recurrent inverted papilloma.Based on the above findings, the decision was made for the patient to undergo the above-stated procedure. Likelihood of success in reducing symptoms was also discussed.  The risks, benefits, alternatives, and details of the procedure were discussed with the patient.  Questions were invited and answered.  Informed consent was obtained.  DESCRIPTION:  The patient was taken to the operating room and placed supine on the operating table.  General endotracheal tube anesthesia was administered by the anesthesiologist.  The patient was positioned and prepped and draped in a standard fashion for right maxillectomy.    1% lidocaine with 1-100,000 epinephrine was infiltrated into the right maxillary gingival sulcus.  An incision was made within the gingival sulcus.  The incision was carried down to the right maxillary periosteum.  The soft tissue over the right anterior maxillary bone was carefully  elevated.  The anterior maxillary bone was then removed in a piecemeal fashion using a combination of osteotome and Kerrison rongeur.  Examination of the right maxillary cavity revealed a 2 cm soft tissue mass.  The soft tissue mass was removed and sent to the pathology department for permanent histologic identification.  The base of the soft tissue mass was extensively cauterized with a suction cautery device.  The inferior orbital nerve was identified and preserved.  The right maxillary cavity was copiously irrigated.  The incision was closed in layers with 4-0 Vicryl sutures.  The care of the patient was turned over to the anesthesiologist.  The patient was awakened from anesthesia without difficulty.  The patient was extubated and transferred to the recovery room in good condition.  OPERATIVE FINDINGS: Recurrent right maxillary inverted papilloma.  SPECIMEN: Right maxillary inverted papilloma.  FOLLOWUP CARE:  The patient will be discharged home once awake and alert. The patient will follow up in my office in approximately 1 week.  Omarie Parcell W Kamyah Wilhelmsen 07/17/2021 2:38 PM

## 2021-07-17 NOTE — Discharge Instructions (Addendum)

## 2021-07-17 NOTE — Transfer of Care (Signed)
Immediate Anesthesia Transfer of Care Note  Patient: Brett Soza  Procedure(s) Performed: RIGHT MEDIAL MAXILLECTOMY (Right)  Patient Location: PACU  Anesthesia Type:General  Level of Consciousness: awake, alert  and oriented  Airway & Oxygen Therapy: Patient Spontanous Breathing and Patient connected to face mask oxygen  Post-op Assessment: Report given to RN and Post -op Vital signs reviewed and stable  Post vital signs: Reviewed and stable  Last Vitals:  Vitals Value Taken Time  BP 142/75 07/17/21 1440  Temp    Pulse 71 07/17/21 1441  Resp 14 07/17/21 1441  SpO2 100 % 07/17/21 1441  Vitals shown include unvalidated device data.  Last Pain:  Vitals:   07/17/21 1136  TempSrc:   PainSc: 0-No pain         Complications: No notable events documented.

## 2021-07-17 NOTE — Anesthesia Preprocedure Evaluation (Addendum)
Anesthesia Evaluation  Patient identified by MRN, date of birth, ID band Patient awake    Reviewed: Allergy & Precautions, NPO status , Patient's Chart, lab work & pertinent test results  Airway Mallampati: II  TM Distance: >3 FB Neck ROM: Full    Dental no notable dental hx.    Pulmonary COPD,  COPD inhaler,    Pulmonary exam normal        Cardiovascular negative cardio ROS Normal cardiovascular exam     Neuro/Psych negative neurological ROS  negative psych ROS   GI/Hepatic negative GI ROS, Neg liver ROS,   Endo/Other  Hypothyroidism   Renal/GU negative Renal ROS     Musculoskeletal negative musculoskeletal ROS (+)   Abdominal   Peds  Hematology negative hematology ROS (+)   Anesthesia Other Findings  recurrent right inverted papilloma  Reproductive/Obstetrics                            Anesthesia Physical Anesthesia Plan  ASA: 3  Anesthesia Plan: General   Post-op Pain Management:    Induction: Intravenous  PONV Risk Score and Plan: 3 and Ondansetron, Dexamethasone and Treatment may vary due to age or medical condition  Airway Management Planned:   Additional Equipment:   Intra-op Plan:   Post-operative Plan: Extubation in OR  Informed Consent: I have reviewed the patients History and Physical, chart, labs and discussed the procedure including the risks, benefits and alternatives for the proposed anesthesia with the patient or authorized representative who has indicated his/her understanding and acceptance.     Dental advisory given  Plan Discussed with: CRNA  Anesthesia Plan Comments:        Anesthesia Quick Evaluation

## 2021-07-17 NOTE — H&P (Signed)
Cc: Recurrent right maxillary inverted papilloma  HPI: The patient is an 86 year old female who returns today for her follow-up evaluation.  The patient was previously seen for right maxillary inverting papilloma.  She underwent right endoscopic sinus surgery and removal of the papilloma in 2020.  According to the patient, she has noted increasing discomfort and pressure sensation in her right midface. At her last visit, recurrent polypoid tissue was noted within the right maxillary sinus.  She subsequently underwent a sinus CT scan.  The CT showed a 2 x 1 cm soft tissue mass within the right maxillary sinus.  The findings are concerning for recurrent inverting papilloma.  Exam: General: Communicates without difficulty, well nourished, no acute distress. Head: Normocephalic, no evidence injury, no tenderness, facial buttresses intact without stepoff. Face/sinus: No tenderness to palpation and percussion. Facial movement is normal and symmetric. Eyes: PERRL, EOMI. No scleral icterus, conjunctivae clear. Neuro: CN II exam reveals vision grossly intact.  No nystagmus at any point of gaze. Ears: Auricles well formed without lesions.  Ear canals are intact without mass or lesion.  No erythema or edema is appreciated.  The TMs are intact without fluid. Nose: External evaluation reveals normal support and skin without lesions.  Dorsum is intact.  Anterior rhinoscopy reveals mildly congested mucosa over anterior aspect of inferior turbinates and intact septum.  No purulence noted. Oral: Oral cavity and oropharynx are intact, symmetric, without erythema or edema.  Mucosa is moist without lesions. Neck: Full range of motion without pain.  There is no significant lymphadenopathy.  No masses palpable.  Thyroid bed within normal limits to palpation.  Parotid glands and submandibular glands equal bilaterally without mass.  Trachea is midline. Neuro:  CN 2-12 grossly intact. A flexible scope was inserted into the right nasal  cavity.  Endoscopy of the interior nasal cavity, superior, inferior, and middle meatus was performed. The sphenoid-ethmoid recess was examined. Edematous mucosa was noted.  Polypoid tissue was noted within the right maxillary sinus.  Olfactory cleft was clear.  Nasopharynx was clear.  Turbinates were hypertrophied but without mass. The procedure was repeated on the contralateral side with similar findings.  The patient tolerated the procedure well.  Assessment: 1.  The patient is noted to have a recurrent polypoid tissue within the right maxillary sinus. The CT showed a 2 x 1 cm soft tissue mass within the right maxillary sinus.  The findings are concerning for recurrent inverting papilloma.  Plan: 1.  The nasal endoscopy findings are reviewed with the patient. 2.  Sinus CT scan images are reviewed with the patient. 3.  Based on the above findings, the patient will benefit from undergoing surgical removal of the recurrent inverting papilloma.  The surgery will likely involve Caldwell luc maxillectomy to completely remove the soft tissue and the underlying bone.  The risk, benefits, and details of the procedure are reviewed with the patient.  Questions are invited and answered. 4.  The patient would like to proceed with the procedure.

## 2021-07-18 NOTE — Anesthesia Postprocedure Evaluation (Signed)
Anesthesia Post Note  Patient: Jillian Scott  Procedure(s) Performed: RIGHT MEDIAL MAXILLECTOMY (Right)     Patient location during evaluation: PACU Anesthesia Type: General Level of consciousness: awake Pain management: pain level controlled Vital Signs Assessment: post-procedure vital signs reviewed and stable Respiratory status: spontaneous breathing, nonlabored ventilation, respiratory function stable and patient connected to nasal cannula oxygen Cardiovascular status: blood pressure returned to baseline and stable Postop Assessment: no apparent nausea or vomiting Anesthetic complications: no   No notable events documented.  Last Vitals:  Vitals:   07/17/21 1455 07/17/21 1510  BP: (!) 143/72 (!) 144/72  Pulse: 82 68  Resp: 13 19  Temp:    SpO2: 94% 95%    Last Pain:  Vitals:   07/17/21 1510  TempSrc:   PainSc: 0-No pain                 Taavi Hoose P Tonika Eden

## 2021-07-20 ENCOUNTER — Encounter (HOSPITAL_COMMUNITY): Payer: Self-pay | Admitting: Otolaryngology

## 2021-07-20 LAB — SURGICAL PATHOLOGY

## 2021-08-24 ENCOUNTER — Telehealth: Payer: Self-pay | Admitting: *Deleted

## 2021-08-24 NOTE — Telephone Encounter (Signed)
CALLED PATIENT TO INFORM OF CT FOR 09-04-21- ARRIVAL TIME- 11:30 AM @ [facility], NO RESTRICTIONS TO TEST, PATIENT TO RECEIVE RESULTS FROM DR. KINARD ON 09-10-21 @ 11 AM, SPOKE WITH PATIENT'S SON- ROY AND HE IS AWARE OF THESE APPTS.

## 2021-09-04 ENCOUNTER — Ambulatory Visit (HOSPITAL_COMMUNITY)
Admission: RE | Admit: 2021-09-04 | Discharge: 2021-09-04 | Disposition: A | Discharge status: Home / Self Care | Payer: Medicare Other | Source: Ambulatory Visit | Attending: Radiation Oncology | Admitting: Radiation Oncology

## 2021-09-04 DIAGNOSIS — C3491 Malignant neoplasm of unspecified part of right bronchus or lung: Secondary | ICD-10-CM | POA: Insufficient documentation

## 2021-09-09 NOTE — Progress Notes (Signed)
Radiation Oncology         [phone] ________________________________  Name: Jillian Scott MRN: [id number]  Date: 09/10/2021  DOB: 1935-09-11  Follow-Up Visit Note  CC: Ngetich, Nelda Bucks, NP  Garner Nash, DO  No diagnosis found.  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 Since her last visit, the patient underwent a maxillofacial CT on 05/07/21 which showed a interval increase in the size of an area of soft tissue along the anterior wall of the right maxillary sinus compared to PET performed on 01/23/2020. At which time, the soft tissue mass was metabolically active, at the site of a previously resected inverted papilloma, concerning for recurrence.   Subsequently, the patient underwent right maxillectomy and excision of a recurrent right maxillary soft tissue mass on 07/17/21 under the care of Dr. Benjamine Mola. 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     Her most recent chest CT on 09/04/21 showed the RUL nodule as stable in size with increasing surrounding collapse/consolidation; likely due to radiation therapy. CT otherwise showed no evidence of metastatic disease.      ***                         Allergies:  has No Known Allergies.  Meds: Current Outpatient Medications  Medication Sig Dispense Refill   acetaminophen (TYLENOL) 500 MG tablet Take 1,000 mg by mouth every 6 (six) hours as needed for mild pain.     albuterol (VENTOLIN HFA) 108 (90 Base) MCG/ACT inhaler Inhale 2 puffs into  the lungs every 6 (six) hours as needed for wheezing or shortness of breath.     ARTIFICIAL TEAR SOLUTION OP Place 2 drops into both eyes daily.     aspirin EC 81 MG tablet Take 81 mg by mouth daily. Swallow whole.     Cholecalciferol (VITAMIN D3) 10 MCG (400 UNIT) CAPS Take 400 Units by mouth daily.     cyanocobalamin 1000 MCG tablet Take 1,000 mcg by mouth daily.     levothyroxine (SYNTHROID) 50 MCG tablet Take 50 mcg by mouth daily.     rosuvastatin (CRESTOR) 10 MG tablet Take 10 mg by mouth daily.     No current facility-administered medications for this encounter.    Physical Findings: The patient is in no acute distress. Patient is alert and oriented.  vitals were not taken for this visit. .  No significant changes. Lungs are clear to auscultation bilaterally. Heart has regular rate and rhythm. No palpable cervical, supraclavicular, or axillary adenopathy. Abdomen soft, non-tender, normal bowel sounds.   Lab Findings: Lab Results  Component Value Date   WBC 4.9 07/03/2021   HGB 12.7 07/03/2021   HCT 39.5 07/03/2021   MCV 92.5 07/03/2021   PLT 238 07/03/2021    Radiographic Findings: CT CHEST WO CONTRAST  Result Date: 09/07/2021 CLINICAL DATA:  Non-small cell lung cancer, assess treatment response. * Tracking Code: BO * EXAM: CT CHEST WITHOUT CONTRAST TECHNIQUE: Multidetector CT imaging of the chest was performed  following the standard protocol without IV contrast. RADIATION DOSE REDUCTION: This exam was performed according to the departmental dose-optimization program which includes automated exposure control, adjustment of the mA and/or kV according to patient size and/or use of iterative reconstruction technique. COMPARISON:  03/04/2021. FINDINGS: Cardiovascular: Atherosclerotic calcification of the aorta, aortic valve and coronary arteries. Heart size is accentuated by pectus deformity. No pericardial effusion. Mediastinum/Nodes: No pathologically enlarged mediastinal or  axillary lymph nodes. Hilar regions are difficult to definitively evaluate without IV contrast. Mild esophageal dilatation and fluid can be seen with dysmotility. Lungs/Pleura: Biapical pleuroparenchymal scarring. Post radiation anterior segment right upper lobe nodule measures approximately 1.0 x 1.4 cm (7/50), stable. Increasing surrounding parenchymal collapse/consolidation and associated fiducial markers. Scattered mucoid impaction. Minimal scarring in the lingula. No pleural fluid. Airway is unremarkable. Upper Abdomen: Low-attenuation lesions in the liver measure up to 1.7 cm in the dome right hepatic lobe, likely cysts. Visualized portions of the adrenal glands, kidneys, spleen, pancreas and stomach are grossly unremarkable. Musculoskeletal: Degenerative changes in the spine. No worrisome lytic or sclerotic lesions. IMPRESSION: 1. Right upper lobe nodule is stable in size, with increasing surrounding collapse/consolidation, likely due to radiation therapy. No evidence of metastatic disease. 2. Aortic atherosclerosis (ICD10-I70.0). Coronary artery calcification. Electronically Signed   By: Lorin Picket M.D.   On: 09/07/2021 09:00    Impression: Stage I non-small cell carcinoma of the right upper lobe  The patient is recovering from the effects of radiation.  ***  Plan:  ***   *** minutes of total time was spent for this patient encounter, including preparation, face-to-face counseling with the patient and coordination of care, physical exam, and documentation of the encounter. ____________________________________  Blair Promise, PhD, MD  This document serves as a record of services personally performed by Gery Pray, MD. It was created on his behalf by Roney Mans, a trained medical scribe. The creation of this record is based on the scribe's personal observations and the provider's statements to them. This document has been checked and approved by the attending provider.

## 2021-09-10 ENCOUNTER — Other Ambulatory Visit: Payer: Self-pay

## 2021-09-10 ENCOUNTER — Encounter: Payer: Self-pay | Admitting: Radiation Oncology

## 2021-09-10 ENCOUNTER — Ambulatory Visit
Admission: RE | Admit: 2021-09-10 | Discharge: 2021-09-10 | Disposition: A | Discharge status: Home / Self Care | Payer: Medicare Other | Source: Ambulatory Visit | Attending: Radiation Oncology | Admitting: Radiation Oncology

## 2021-09-10 DIAGNOSIS — R0602 Shortness of breath: Secondary | ICD-10-CM | POA: Insufficient documentation

## 2021-09-10 DIAGNOSIS — Z7982 Long term (current) use of aspirin: Secondary | ICD-10-CM | POA: Insufficient documentation

## 2021-09-10 DIAGNOSIS — Z85118 Personal history of other malignant neoplasm of bronchus and lung: Secondary | ICD-10-CM | POA: Insufficient documentation

## 2021-09-10 DIAGNOSIS — Z79890 Hormone replacement therapy (postmenopausal): Secondary | ICD-10-CM | POA: Insufficient documentation

## 2021-09-10 DIAGNOSIS — Z79899 Other long term (current) drug therapy: Secondary | ICD-10-CM | POA: Diagnosis not present

## 2021-09-10 DIAGNOSIS — Z923 Personal history of irradiation: Secondary | ICD-10-CM | POA: Insufficient documentation

## 2021-09-10 DIAGNOSIS — I700 Atherosclerosis of aorta: Secondary | ICD-10-CM | POA: Insufficient documentation

## 2021-09-10 DIAGNOSIS — J984 Other disorders of lung: Secondary | ICD-10-CM | POA: Insufficient documentation

## 2021-09-10 DIAGNOSIS — M954 Acquired deformity of chest and rib: Secondary | ICD-10-CM | POA: Diagnosis not present

## 2021-09-10 DIAGNOSIS — C3491 Malignant neoplasm of unspecified part of right bronchus or lung: Secondary | ICD-10-CM

## 2021-09-10 DIAGNOSIS — C3411 Malignant neoplasm of upper lobe, right bronchus or lung: Secondary | ICD-10-CM | POA: Diagnosis present

## 2021-09-10 NOTE — Progress Notes (Addendum)
Brytnee Bechler is here today for follow up post radiation to the lung.  Lung Side: Right,patient completed treatment on 04/10/20.  Does the patient complain of any of the following: Pain:No Shortness of breath w/wo exertion: Yes, increased and chest tightness at times.  Cough: No Hemoptysis: No Pain with swallowing: No Swallowing/choking concerns: No Appetite: Fair, encouraged patient to drink ensure or boost. Patient voiced understanding.  Energy Level: Low Post radiation skin Changes: No     Additional comments if applicable: Patient reports increased weakness and just not feeling well.   BP (!) 144/82 (BP Location: Left Arm, Patient Position: Sitting, Cuff Size: Normal)   Pulse 89   Temp 98.4 F (36.9 C) (Oral)   Resp 19   Wt 112 lb 6.4 oz (51 kg)   SpO2 99%   BMI 19.29 kg/m

## 2021-09-11 ENCOUNTER — Ambulatory Visit: Payer: Medicare Other | Admitting: Family

## 2021-10-08 DIAGNOSIS — F03A0 Unspecified dementia, mild, without behavioral disturbance, psychotic disturbance, mood disturbance, and anxiety: Secondary | ICD-10-CM | POA: Diagnosis not present

## 2021-10-08 DIAGNOSIS — I700 Atherosclerosis of aorta: Secondary | ICD-10-CM | POA: Diagnosis not present

## 2021-10-08 DIAGNOSIS — R636 Underweight: Secondary | ICD-10-CM | POA: Diagnosis not present

## 2021-10-08 DIAGNOSIS — C3491 Malignant neoplasm of unspecified part of right bronchus or lung: Secondary | ICD-10-CM | POA: Diagnosis not present

## 2022-03-08 NOTE — Progress Notes (Signed)
Merica Murden is here today for follow up post radiation to the lung.  Lung Side: Rt lung 04/10/20.   Does the patient complain of any of the following: Pain:No Shortness of breath w/wo exertion:  With actiivty Cough: no Hemoptysis: no Pain with swallowing: no Swallowing/choking concerns: no Appetite: varies Energy Level: good Post radiation skin Changes: no isues    Additional comments if applicable: Vitals:   0000000 1126  BP: (!) 144/73  Pulse: 68  Resp: 18  SpO2: 100%

## 2022-03-12 ENCOUNTER — Ambulatory Visit (HOSPITAL_COMMUNITY)
Admission: RE | Admit: 2022-03-12 | Discharge: 2022-03-12 | Disposition: A | Discharge status: Home / Self Care | Payer: Medicare Other | Source: Ambulatory Visit | Attending: Radiation Oncology | Admitting: Radiation Oncology

## 2022-03-12 DIAGNOSIS — R0602 Shortness of breath: Secondary | ICD-10-CM | POA: Diagnosis not present

## 2022-03-12 DIAGNOSIS — C3491 Malignant neoplasm of unspecified part of right bronchus or lung: Secondary | ICD-10-CM | POA: Insufficient documentation

## 2022-03-12 DIAGNOSIS — I700 Atherosclerosis of aorta: Secondary | ICD-10-CM | POA: Diagnosis not present

## 2022-03-13 NOTE — Progress Notes (Signed)
  Radiation Oncology         (336) [phone] ________________________________  Name: Jillian Scott MRN: [id number]  Date: 03/15/2022  DOB: 06/21/1935  Follow-Up Visit Note  CC: [name], DO  Icard, Bradley L, DO  No diagnosis found.  Diagnosis: Stage I non-small cell carcinoma of the right upper lobe   Interval Since Last Radiation: 1 year, 11 months, and 2 days   Intent: Curative   Radiation Treatment Dates: 04/03/2020 through 04/10/2020   Site: Right lung Technique: SBRT Total Dose (Gy): 54/54 Dose per Fx (Gy): 18 Completed Fx: 3/3 Beam Energies: 6XFFF  Narrative:  The patient returns today for routine follow-up and to review recent imaging. He was last seen here for follow-up on 09/10/21.   Her most recent chest CT on 03/12/22 demonstrated: ***                                Allergies:  has No Known Allergies.  Meds: Current Outpatient Medications  Medication Sig Dispense Refill   acetaminophen (TYLENOL) 500 MG tablet Take 1,000 mg by mouth every 6 (six) hours as needed for mild pain.     albuterol (VENTOLIN HFA) 108 (90 Base) MCG/ACT inhaler Inhale 2 puffs into the lungs every 6 (six) hours as needed for wheezing or shortness of breath.     ARTIFICIAL TEAR SOLUTION OP Place 2 drops into both eyes daily. (Patient not taking: Reported on 09/10/2021)     aspirin EC 81 MG tablet Take 81 mg by mouth daily. Swallow whole.     Cholecalciferol (VITAMIN D3) 10 MCG (400 UNIT) CAPS Take 400 Units by mouth daily.     cyanocobalamin 1000 MCG tablet Take 1,000 mcg by mouth daily.     levothyroxine (SYNTHROID) 50 MCG tablet Take 50 mcg by mouth daily.     rosuvastatin (CRESTOR) 10 MG tablet Take 10 mg by mouth daily.     No current facility-administered medications for this encounter.    Physical Findings: The patient is in no acute distress. Patient is alert and oriented.  vitals were not taken for this visit. .  No significant changes. Lungs are clear to auscultation  bilaterally. Heart has regular rate and rhythm. No palpable cervical, supraclavicular, or axillary adenopathy. Abdomen soft, non-tender, normal bowel sounds.   Lab Findings: Lab Results  Component Value Date   WBC 4.9 07/03/2021   HGB 12.7 07/03/2021   HCT 39.5 07/03/2021   MCV 92.5 07/03/2021   PLT 238 07/03/2021    Radiographic Findings: No results found.  Impression: Stage I non-small cell carcinoma of the right upper lobe   The patient is recovering from the effects of radiation.  ***  Plan:  ***   *** minutes of total time was spent for this patient encounter, including preparation, face-to-face counseling with the patient and coordination of care, physical exam, and documentation of the encounter. ____________________________________  Blair Promise, PhD, MD  This document serves as a record of services personally performed by Gery Pray, MD. It was created on his behalf by Roney Mans, a trained medical scribe. The creation of this record is based on the scribe's personal observations and the provider's statements to them. This document has been checked and approved by the attending provider.

## 2022-03-15 ENCOUNTER — Encounter: Payer: Self-pay | Admitting: Radiation Oncology

## 2022-03-15 ENCOUNTER — Ambulatory Visit
Admission: RE | Admit: 2022-03-15 | Discharge: 2022-03-15 | Disposition: A | Discharge status: Home / Self Care | Payer: Medicare Other | Source: Ambulatory Visit | Attending: Radiation Oncology | Admitting: Radiation Oncology

## 2022-03-15 VITALS — BP 144/73 | HR 68 | Resp 18

## 2022-03-15 DIAGNOSIS — C3491 Malignant neoplasm of unspecified part of right bronchus or lung: Secondary | ICD-10-CM

## 2022-04-07 DIAGNOSIS — J449 Chronic obstructive pulmonary disease, unspecified: Secondary | ICD-10-CM | POA: Diagnosis not present

## 2022-04-07 DIAGNOSIS — I700 Atherosclerosis of aorta: Secondary | ICD-10-CM | POA: Diagnosis not present

## 2022-04-07 DIAGNOSIS — C3491 Malignant neoplasm of unspecified part of right bronchus or lung: Secondary | ICD-10-CM | POA: Diagnosis not present

## 2022-04-07 DIAGNOSIS — R2681 Unsteadiness on feet: Secondary | ICD-10-CM | POA: Diagnosis not present

## 2022-04-07 DIAGNOSIS — F03A3 Unspecified dementia, mild, with mood disturbance: Secondary | ICD-10-CM | POA: Diagnosis not present

## 2022-04-07 DIAGNOSIS — R636 Underweight: Secondary | ICD-10-CM | POA: Diagnosis not present

## 2022-05-06 ENCOUNTER — Emergency Department (HOSPITAL_BASED_OUTPATIENT_CLINIC_OR_DEPARTMENT_OTHER): Payer: Medicare Other

## 2022-05-06 ENCOUNTER — Emergency Department (HOSPITAL_BASED_OUTPATIENT_CLINIC_OR_DEPARTMENT_OTHER)
Admission: EM | Admit: 2022-05-06 | Discharge: 2022-05-06 | Disposition: A | Discharge status: Home / Self Care | Payer: Medicare Other | Attending: Emergency Medicine | Admitting: Emergency Medicine

## 2022-05-06 ENCOUNTER — Other Ambulatory Visit: Payer: Self-pay

## 2022-05-06 ENCOUNTER — Encounter (HOSPITAL_BASED_OUTPATIENT_CLINIC_OR_DEPARTMENT_OTHER): Payer: Self-pay | Admitting: Emergency Medicine

## 2022-05-06 ENCOUNTER — Other Ambulatory Visit (HOSPITAL_BASED_OUTPATIENT_CLINIC_OR_DEPARTMENT_OTHER): Payer: Self-pay

## 2022-05-06 DIAGNOSIS — K5900 Constipation, unspecified: Secondary | ICD-10-CM

## 2022-05-06 DIAGNOSIS — K7689 Other specified diseases of liver: Secondary | ICD-10-CM | POA: Diagnosis not present

## 2022-05-06 DIAGNOSIS — K3189 Other diseases of stomach and duodenum: Secondary | ICD-10-CM | POA: Diagnosis not present

## 2022-05-06 DIAGNOSIS — K5641 Fecal impaction: Secondary | ICD-10-CM

## 2022-05-06 DIAGNOSIS — Z7982 Long term (current) use of aspirin: Secondary | ICD-10-CM | POA: Insufficient documentation

## 2022-05-06 DIAGNOSIS — R110 Nausea: Secondary | ICD-10-CM | POA: Insufficient documentation

## 2022-05-06 DIAGNOSIS — R1084 Generalized abdominal pain: Secondary | ICD-10-CM | POA: Diagnosis not present

## 2022-05-06 DIAGNOSIS — R0602 Shortness of breath: Secondary | ICD-10-CM | POA: Diagnosis not present

## 2022-05-06 DIAGNOSIS — R109 Unspecified abdominal pain: Secondary | ICD-10-CM

## 2022-05-06 DIAGNOSIS — R1030 Lower abdominal pain, unspecified: Secondary | ICD-10-CM | POA: Diagnosis not present

## 2022-05-06 LAB — URINALYSIS, ROUTINE W REFLEX MICROSCOPIC
Bilirubin Urine: NEGATIVE
Glucose, UA: NEGATIVE mg/dL
Hgb urine dipstick: NEGATIVE
Ketones, ur: NEGATIVE mg/dL
Leukocytes,Ua: NEGATIVE
Nitrite: NEGATIVE
Protein, ur: NEGATIVE mg/dL
Specific Gravity, Urine: 1.015 (ref 1.005–1.030)
pH: 7 (ref 5.0–8.0)

## 2022-05-06 LAB — CBC WITH DIFFERENTIAL/PLATELET
Abs Immature Granulocytes: 0.02 10*3/uL (ref 0.00–0.07)
Basophils Absolute: 0 10*3/uL (ref 0.0–0.1)
Basophils Relative: 1 %
Eosinophils Absolute: 0 10*3/uL (ref 0.0–0.5)
Eosinophils Relative: 0 %
HCT: 39.9 % (ref 36.0–46.0)
Hemoglobin: 12.1 g/dL (ref 12.0–15.0)
Immature Granulocytes: 0 %
Lymphocytes Relative: 13 %
Lymphs Abs: 0.9 10*3/uL (ref 0.7–4.0)
MCH: 29.1 pg (ref 26.0–34.0)
MCHC: 30.3 g/dL (ref 30.0–36.0)
MCV: 95.9 fL (ref 80.0–100.0)
Monocytes Absolute: 0.5 10*3/uL (ref 0.1–1.0)
Monocytes Relative: 7 %
Neutro Abs: 5.5 10*3/uL (ref 1.7–7.7)
Neutrophils Relative %: 79 %
Platelets: 227 10*3/uL (ref 150–400)
RBC: 4.16 MIL/uL (ref 3.87–5.11)
RDW: 14.6 % (ref 11.5–15.5)
WBC: 7 10*3/uL (ref 4.0–10.5)
nRBC: 0 % (ref 0.0–0.2)

## 2022-05-06 LAB — COMPREHENSIVE METABOLIC PANEL
ALT: 13 U/L (ref 0–44)
AST: 23 U/L (ref 15–41)
Albumin: 4 g/dL (ref 3.5–5.0)
Alkaline Phosphatase: 71 U/L (ref 38–126)
Anion gap: 5 (ref 5–15)
BUN: 11 mg/dL (ref 8–23)
CO2: 36 mmol/L — ABNORMAL HIGH (ref 22–32)
Calcium: 9.4 mg/dL (ref 8.9–10.3)
Chloride: 99 mmol/L (ref 98–111)
Creatinine, Ser: 0.67 mg/dL (ref 0.44–1.00)
GFR, Estimated: >60 mL/min (ref >60)
Glucose, Bld: 91 mg/dL (ref 70–99)
Potassium: 4.1 mmol/L (ref 3.5–5.1)
Sodium: 140 mmol/L (ref 135–145)
Total Bilirubin: 0.5 mg/dL (ref 0.3–1.2)
Total Protein: 7 g/dL (ref 6.5–8.1)

## 2022-05-06 LAB — LACTIC ACID, PLASMA: Lactic Acid, Venous: 0.8 mmol/L (ref 0.5–1.9)

## 2022-05-06 LAB — LIPASE, BLOOD: Lipase: 45 U/L (ref 11–51)

## 2022-05-06 MED ORDER — FLEET ENEMA 7-19 GM/118ML RE ENEM
1.0000 | ENEMA | Freq: Once | RECTAL | Status: AC
  Administered 2022-05-06: 1 via RECTAL
  Filled 2022-05-06: qty 1

## 2022-05-06 MED ORDER — SODIUM CHLORIDE 0.9 % IV BOLUS
1000.0000 mL | Freq: Once | INTRAVENOUS | Status: AC
  Administered 2022-05-06: 1000 mL via INTRAVENOUS

## 2022-05-06 MED ORDER — POLYETHYLENE GLYCOL 3350 17 G PO PACK: 17.0000 g | PACK | Freq: Every day | ORAL | 0 refills | Status: AC

## 2022-05-06 MED ORDER — IOHEXOL 300 MG/ML  SOLN
100.0000 mL | Freq: Once | INTRAMUSCULAR | Status: AC | PRN
  Administered 2022-05-06: 60 mL via INTRAVENOUS

## 2022-05-06 NOTE — ED Notes (Signed)
Pt declined EDP offer to disimpact for fecal impaction ,requested enema.

## 2022-05-06 NOTE — ED Notes (Signed)
ED Provider at bedside. 

## 2022-05-06 NOTE — ED Notes (Signed)
ED Provider at bedside attempting to disimpact.

## 2022-05-06 NOTE — ED Notes (Signed)
Patient transported to CT 

## 2022-05-06 NOTE — ED Notes (Signed)
Discharge instructions, follow up care, and prescription reviewed and explained, pt verbalized understanding and had no further questions on d/c. Pt was taken to son's POV via wheelchair without incident.

## 2022-05-06 NOTE — ED Notes (Signed)
Pt requests to remain on bedside commode. No stool passed at this time

## 2022-05-06 NOTE — ED Notes (Signed)
Pt ambulated by Kennyth Arnold, RT

## 2022-05-06 NOTE — ED Notes (Signed)
Pt placed back in bed, endorses inability to pass stool. Pt is willing to allow EDP [place] to attempt disimpaction. Provider notified.

## 2022-05-06 NOTE — ED Provider Notes (Signed)
[facility] EMERGENCY DEPARTMENT AT [facility] Provider Note   CSN: [id number] Arrival date & time: 05/06/22  0750     History  Chief Complaint  Patient presents with   Abdominal Pain   Constipation    Jillian Scott is a 87 y.o. female.  The history is provided by the patient and medical records. No language interpreter was used.  Abdominal Pain Pain location:  Generalized Pain quality: aching, cramping and dull   Pain radiates to:  Does not radiate Pain severity:  Severe Onset quality:  Gradual Duration:  3 days Timing:  Constant Progression:  Worsening Chronicity:  New Relieved by:  Nothing Worsened by:  Palpation Ineffective treatments:  None tried Associated symptoms: constipation and nausea   Associated symptoms: no chest pain, no chills, no cough, no diarrhea, no dysuria, no fatigue, no fever, no shortness of breath and no vomiting   Constipation Severity:  Severe Time since last bowel movement:  1 week Timing:  Constant Progression:  Worsening Chronicity:  Recurrent Context: dehydration (pt reports she suspects some dehydration)   Stool description:  None produced Relieved by:  Nothing Worsened by:  Nothing Ineffective treatments:  None tried Associated symptoms: abdominal pain and nausea   Associated symptoms: no back pain, no diarrhea, no dysuria, no fever and no vomiting        Home Medications Prior to Admission medications   Medication Sig Start Date End Date Taking? Authorizing Provider  acetaminophen (TYLENOL) 500 MG tablet Take 1,000 mg by mouth every 6 (six) hours as needed for mild pain.    [provider]  albuterol (VENTOLIN HFA) 108 (90 Base) MCG/ACT inhaler Inhale 2 puffs into the lungs every 6 (six) hours as needed for wheezing or shortness of breath.    [provider]  ARTIFICIAL TEAR SOLUTION OP Place 2 drops into both eyes daily. Patient not taking: Reported on 09/10/2021    [provider]   aspirin EC 81 MG tablet Take 81 mg by mouth daily. Swallow whole.    [provider]  Cholecalciferol (VITAMIN D3) 10 MCG (400 UNIT) CAPS Take 400 Units by mouth daily.    [provider]  cyanocobalamin 1000 MCG tablet Take 1,000 mcg by mouth daily.    [provider]  levothyroxine (SYNTHROID) 50 MCG tablet Take 50 mcg by mouth daily. 12/11/19   [provider]  rosuvastatin (CRESTOR) 10 MG tablet Take 10 mg by mouth daily.    [provider]      Allergies    Patient has no known allergies.    Review of Systems   Review of Systems  Constitutional:  Negative for chills, fatigue and fever.  HENT:  Negative for congestion.   Respiratory:  Negative for cough, choking, chest tightness, shortness of breath and wheezing.   Cardiovascular:  Negative for chest pain.  Gastrointestinal:  Positive for abdominal distention (per pt), abdominal pain, constipation and nausea. Negative for diarrhea and vomiting.  Genitourinary:  Negative for dysuria, flank pain and frequency.  Musculoskeletal:  Negative for back pain, neck pain and neck stiffness.  Skin:  Negative for rash.  Neurological:  Negative for headaches.  Psychiatric/Behavioral:  Negative for agitation.   All other systems reviewed and are negative.   Physical Exam Updated Vital Signs Temp 97.8 F (36.6 C) (Oral)   Wt 46.3 kg   BMI 17.51 kg/m  Physical Exam Vitals and nursing note reviewed.  Constitutional:      General: She is not  in acute distress.    Appearance: She is well-developed. She is not ill-appearing, toxic-appearing or diaphoretic.  HENT:     Head: Normocephalic and atraumatic.     Nose: No congestion or rhinorrhea.     Mouth/Throat:     Mouth: Mucous membranes are dry.  Eyes:     Conjunctiva/sclera: Conjunctivae normal.     Pupils: Pupils are equal, round, and reactive to light.  Cardiovascular:     Rate and Rhythm: Normal rate and regular rhythm.     Heart sounds:  No murmur heard. Pulmonary:     Effort: Pulmonary effort is normal. No respiratory distress.     Breath sounds: Normal breath sounds. No stridor. No wheezing, rhonchi or rales.  Chest:     Chest wall: No tenderness.  Abdominal:     General: Bowel sounds are normal. There is no distension.     Palpations: Abdomen is soft.     Tenderness: There is generalized abdominal tenderness. There is no right CVA tenderness, left CVA tenderness, guarding or rebound.  Musculoskeletal:        General: No swelling or tenderness.     Cervical back: Neck supple.  Skin:    General: Skin is warm and dry.     Capillary Refill: Capillary refill takes less than 2 seconds.     Findings: No erythema or rash.  Neurological:     Mental Status: She is alert.  Psychiatric:        Mood and Affect: Mood normal.     ED Results / Procedures / Treatments   Labs (all labs ordered are listed, but only abnormal results are displayed) Labs Reviewed  COMPREHENSIVE METABOLIC PANEL - Abnormal; Notable for the following components:      Result Value   CO2 36 (*)    All other components within normal limits  URINALYSIS, ROUTINE W REFLEX MICROSCOPIC - Abnormal; Notable for the following components:   Color, Urine COLORLESS (*)    All other components within normal limits  CBC WITH DIFFERENTIAL/PLATELET  LACTIC ACID, PLASMA  LIPASE, BLOOD    EKG None  Radiology DG Chest Portable 1 View  Result Date: 05/06/2022 CLINICAL DATA:  Shortness of breath. History of lung cancer and COPD. EXAM: PORTABLE CHEST 1 VIEW COMPARISON:  CT 03/12/2022.  Radiographs 07/23/2020 and 03/11/2020. FINDINGS: 0957 hours. The heart size and mediastinal contours are stable with aortic atherosclerosis. Radiation changes in the right upper lobe surrounding fiducial markers are unchanged. No definite superimposed airspace disease, edema, pleural effusion or pneumothorax identified. The bones appear unchanged. Telemetry leads overlie the chest and  upper abdomen. IMPRESSION: Stable postoperative chest. No acute cardiopulmonary process. Stable posttreatment changes in the right upper lobe. Electronically Signed   By: Carey Bullocks M.D.   On: 05/06/2022 10:07   CT ABDOMEN PELVIS W CONTRAST  Result Date: 05/06/2022 CLINICAL DATA:  87 year old female with history of lung cancer. Abdominal distension, no bowel movement for 1 week. Abdominal pain. EXAM: CT ABDOMEN AND PELVIS WITH CONTRAST TECHNIQUE: Multidetector CT imaging of the abdomen and pelvis was performed using the standard protocol following bolus administration of intravenous contrast. RADIATION DOSE REDUCTION: This exam was performed according to the departmental dose-optimization program which includes automated exposure control, adjustment of the mA and/or kV according to patient size and/or use of iterative reconstruction technique. CONTRAST:  60mL OMNIPAQUE IOHEXOL 300 MG/ML  SOLN COMPARISON:  Chest CT 03/12/2022 and earlier. CT Abdomen and Pelvis 01/09/2018. FINDINGS: Lower chest: Chronic cardiomegaly superimposed on  narrow AP chest diameter, thoracic pectus deformity. No pericardial effusion. Lung bases are negative. Hepatobiliary: Liver and gallbladder are stable and within normal limits (chronic benign hepatic dome cyst - no follow-up imaging recommended). Pancreas: Negative. Spleen: Negative. Adrenals/Urinary Tract: Normal adrenal glands. Nonobstructed kidneys with symmetric renal enhancement and contrast excretion. Decompressed ureters. Bladder is mildly displaced anteriorly from redundant pelvic large bowel and retained stool. But the bladder is only mildly distended. Stomach/Bowel: 8 cm stool ball in the rectum (series 2, image 64 and sagittal image 51). Overall limited bowel detail due to absence of intra-abdominal fat now. But no dilated large or small bowel loops. Redundant sigmoid colon upstream of the rectum with abundant retained stool, and highly redundant sigmoid which tracks  through the bilateral pelvis (decompressed along the left pelvic side wall series 2, image 57. Upstream retained stool in nondilated descending colon. Transverse and right colon retained stool also. No free air or free fluid. Vascular/Lymphatic: Aortoiliac calcified atherosclerosis. Normal caliber abdominal aorta. Major arterial structures appear patent. Portal venous system appears to be patent. No lymphadenopathy identified. Reproductive: Chronically absent uterus. Diminutive ovaries along the pelvic sidewalls are within normal limits. Other: Cachexia, new since 2019.  No pelvis free fluid. 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 SI joints remain patent. No acute or suspicious osseous abnormality. IMPRESSION: 1. Cachexia, limiting some bowel detail. But there is an 8 cm stool ball in the rectum, and highly redundant upstream sigmoid colon also with retained stool. Suspect fecal impaction. 2. No other acute or inflammatory process identified in the abdomen or pelvis. No metastatic disease identified. 3.  Aortic Atherosclerosis (ICD10-I70.0). Electronically Signed   By: Odessa Fleming M.D.   On: 05/06/2022 09:43    Procedures Fecal disimpaction  Date/Time: 05/06/2022 2:35 PM  Performed by: Heide Scales, MD Authorized by: Heide Scales, MD  Consent: Verbal consent obtained. Risks and benefits: risks, benefits and alternatives were discussed Consent given by: patient Patient understanding: patient states understanding of the procedure being performed Patient identity confirmed: verbally with patient Time out: Immediately prior to procedure a "time out" was called to verify the correct patient, procedure, equipment, support staff and site/side marked as required.  Sedation: Patient sedated: no  Patient tolerance: patient tolerated the procedure well with no immediate complications       Medications Ordered in ED Medications  sodium chloride 0.9 % bolus 1,000  mL ( Intravenous Stopped 05/06/22 0945)  iohexol (OMNIPAQUE) 300 MG/ML solution 100 mL (60 mLs Intravenous Contrast Given 05/06/22 0909)  sodium phosphate (FLEET) 7-19 GM/118ML enema 1 enema (1 enema Rectal Given 05/06/22 1025)    ED Course/ Medical Decision Making/ A&P                             Medical Decision Making Amount and/or Complexity of Data Reviewed Labs: ordered. Radiology: ordered.  Risk OTC drugs. Prescription drug management.    Jalen Oberry is a 87 y.o. female with a past medical history significant for hiatal hernia, GERD, hyperlipidemia, COPD, maxillary sinus mass status postresection, lung cancer status post radiation, and previous hysterectomy who presents with abdominal pain and constipation.  According to patient, she has not had a bowel movement in nearly a week and is concerned about this.  She reports last few days she has developed abdominal discomfort that is moderate to severe and was waking her up at night.  She reports some nausea but no vomiting.  Denies  any urinary changes fevers, or chills.  Denies any congestion, cough, chest pain, shortness of breath.  Denies trauma.  Denies any rashes to suggest shingles.  Denies any other complaints.  She does report she has had constipation before but had not had this bad.  On exam, lungs clear.  Chest nontender.  Abdomen is diffusely tender but I did appreciate some bowel sounds.  Back and flanks nontender.  No rashes seen.  Good pulses in extremities.  Patient resting at this time and did not want pain medicine.  Clinically I do suspect she has some constipation however due to her degree of discomfort and report that she is having some distention, will get a CT scan to rule out bowel obstruction diverticulitis or other cause of symptoms.  If there is evidence of infection, will discuss disimpaction versus enema versus other management.  Anticipate reassessment after labs and imaging.  With her dry mucous membranes  and her report that she feels dehydrated, will give some fluids as this may be contributing to her constipation.  9:44 AM Patient went to bathroom and nursing reports she desatted with her oxygen saturations going down to 83% on room air.  She will be placed on oxygen and will get chest x-ray.  Awaiting results of CT abdomen pelvis to determine etiology of her symptoms.  10:10 AM CT returned showing no evidence of acute obstruction but did show evidence of suspected fecal impaction.  I suspect that this is caused pressure on her diaphragm causing her to have the hypoxia and shortness of breath with ambulation.  Patient is feeling well not rest and is on 2 L nasal cannula.  I offered manual disimpaction but she refused at this time.  She would rather try an enema to get things moving.  Patient is able to move her bowels and her breathing improves, anticipate discharge home.  If she continues to have hypoxia, patient may require admission.  X-ray of the chest appeared unchanged from prior with no evidence of acute pneumonia at this time.  Will await enema attempt.  12:00 PM Patient did not have much success with the initial enema attempt and was amenable to trying manual disimpaction.  Manual disimpaction was attempted and a moderate amount of stool was removed.  Will let her try to have a bowel movement and if she passes some stool, anticipate trial of oxygen removal and likely discharge if she passes.  2:32 PM After bowel movement, she was feeling better and was able to ambulate without hypoxia.  She would like to go home now.  Will let her be discharged to take MiraLAX and follow-up with her primary doctor.  Patient agrees with plan of care and was discharged in good condition.         Final Clinical Impression(s) / ED Diagnoses Final diagnoses:  Fecal impaction  Abdominal cramping  Constipation, unspecified constipation type    Rx / DC Orders ED Discharge Orders          Ordered     polyethylene glycol (MIRALAX / GLYCOLAX) 17 g packet  Daily        05/06/22 1433            Clinical Impression: 1. Fecal impaction   2. Abdominal cramping   3. Constipation, unspecified constipation type     Disposition: Discharge  Condition: Good  I have discussed the results, Dx and Tx plan with the pt(& family if present). He/she/they expressed understanding and agree(s) with the plan.  Discharge instructions discussed at great length. Strict return precautions discussed and pt &/or family have verbalized understanding of the instructions. No further questions at time of discharge.    New Prescriptions   POLYETHYLENE GLYCOL (MIRALAX / GLYCOLAX) 17 G PACKET    Take 17 g by mouth daily.    Follow Up: [facility] [address] [place] [address] [place] [address] [phone] Schedule an appointment as soon as possible for a visit    [facility] Emergency Department at [facility] [address] [place] [address] [phone]        Akeelah Seppala, Canary Brim, MD 05/06/22 1435

## 2022-05-06 NOTE — ED Notes (Signed)
Pt placed on 2L [place], Stacy, RT and EDP T[place] notified.

## 2022-05-06 NOTE — ED Notes (Signed)
First contact with patient. Pt arrived via triage from home with c/o lower ABD pain with constipation x 1 week.  Pt. denies shob, is A&OX 4, resp. even/unlabored. Pt placed into gown and on monitor, call light within reach. Patient updated on plan of care. Will continue to monitor patient.

## 2022-05-06 NOTE — ED Notes (Signed)
Pts son in [place]

## 2022-05-06 NOTE — ED Notes (Signed)
Pt ambulated to restroom, o2 sats dropped to 83%, pt endorses shob when ambulating.  Pt recovered to 93%, HR 69. EDP Teg[place] notified.

## 2022-05-06 NOTE — ED Triage Notes (Signed)
Pt brought in by son, to triage in wheelchair with c/o lower abd pain and constipation x 1 week

## 2022-05-06 NOTE — Discharge Instructions (Signed)
Your history, exam and workup today led [place] to get a CT scan that did not show evidence of bowel obstruction but did show fecal impaction and constipation.  We did a manual disimpaction that helped remove some stool and then you are able to have a small bowel movement.  This also helped her breathing and oxygen and now we feel you are safe for discharge home.  Please call and follow-up with your primary doctor and start taking MiraLAX to help with your bowels.  Please rest and stay hydrated.  If any symptoms change or worsen acutely, please return to the nearest emergency room.

## 2022-05-06 NOTE — ED Notes (Signed)
Pt on bedside commode, attempting BM

## 2022-05-06 NOTE — ED Notes (Signed)
Pt placed back on bedside commode to attempt to pass stool

## 2022-05-12 ENCOUNTER — Telehealth: Payer: Self-pay

## 2022-05-12 NOTE — Telephone Encounter (Signed)
     Patient  visit on 4/18  at [facility]  Have you been able to follow up with your primary care physician? Yes   The patient was or was not able to obtain any needed medicine or equipment. Yes   Are there diet recommendations that you are having difficulty following? Na   Patient expresses understanding of discharge instructions and education provided has no other needs at this time.  Yes      Jillian Scott Pop Health Care Guide, [place] [phone] [address], [place], [place] [address] Phone: [phone] Email: [email or web address]    

## 2022-06-01 DIAGNOSIS — R413 Other amnesia: Secondary | ICD-10-CM | POA: Diagnosis not present

## 2022-06-01 DIAGNOSIS — Z0000 Encounter for general adult medical examination without abnormal findings: Secondary | ICD-10-CM | POA: Diagnosis not present

## 2022-06-01 DIAGNOSIS — E039 Hypothyroidism, unspecified: Secondary | ICD-10-CM | POA: Diagnosis not present

## 2022-06-01 DIAGNOSIS — Z79899 Other long term (current) drug therapy: Secondary | ICD-10-CM | POA: Diagnosis not present

## 2022-06-01 DIAGNOSIS — J3489 Other specified disorders of nose and nasal sinuses: Secondary | ICD-10-CM | POA: Diagnosis not present

## 2022-06-01 DIAGNOSIS — K219 Gastro-esophageal reflux disease without esophagitis: Secondary | ICD-10-CM | POA: Diagnosis not present

## 2022-06-01 DIAGNOSIS — K5900 Constipation, unspecified: Secondary | ICD-10-CM | POA: Diagnosis not present

## 2022-06-01 DIAGNOSIS — E785 Hyperlipidemia, unspecified: Secondary | ICD-10-CM | POA: Diagnosis not present

## 2022-06-01 DIAGNOSIS — E46 Unspecified protein-calorie malnutrition: Secondary | ICD-10-CM | POA: Diagnosis not present

## 2022-06-01 DIAGNOSIS — C3491 Malignant neoplasm of unspecified part of right bronchus or lung: Secondary | ICD-10-CM | POA: Diagnosis not present

## 2022-06-01 DIAGNOSIS — I700 Atherosclerosis of aorta: Secondary | ICD-10-CM | POA: Diagnosis not present

## 2022-06-16 DIAGNOSIS — K219 Gastro-esophageal reflux disease without esophagitis: Secondary | ICD-10-CM | POA: Diagnosis not present

## 2022-06-16 DIAGNOSIS — C3491 Malignant neoplasm of unspecified part of right bronchus or lung: Secondary | ICD-10-CM | POA: Diagnosis not present

## 2022-06-16 DIAGNOSIS — J3489 Other specified disorders of nose and nasal sinuses: Secondary | ICD-10-CM | POA: Diagnosis not present

## 2022-06-16 DIAGNOSIS — Z0000 Encounter for general adult medical examination without abnormal findings: Secondary | ICD-10-CM | POA: Diagnosis not present

## 2022-06-16 DIAGNOSIS — Z79899 Other long term (current) drug therapy: Secondary | ICD-10-CM | POA: Diagnosis not present

## 2022-06-16 DIAGNOSIS — J8410 Pulmonary fibrosis, unspecified: Secondary | ICD-10-CM | POA: Diagnosis not present

## 2022-06-16 DIAGNOSIS — E039 Hypothyroidism, unspecified: Secondary | ICD-10-CM | POA: Diagnosis not present

## 2022-06-16 DIAGNOSIS — E46 Unspecified protein-calorie malnutrition: Secondary | ICD-10-CM | POA: Diagnosis not present

## 2022-06-16 DIAGNOSIS — K5900 Constipation, unspecified: Secondary | ICD-10-CM | POA: Diagnosis not present

## 2022-06-16 DIAGNOSIS — E785 Hyperlipidemia, unspecified: Secondary | ICD-10-CM | POA: Diagnosis not present

## 2022-06-16 DIAGNOSIS — J449 Chronic obstructive pulmonary disease, unspecified: Secondary | ICD-10-CM | POA: Diagnosis not present

## 2022-06-16 DIAGNOSIS — I700 Atherosclerosis of aorta: Secondary | ICD-10-CM | POA: Diagnosis not present

## 2022-08-17 ENCOUNTER — Telehealth: Payer: Self-pay | Admitting: *Deleted

## 2022-08-17 NOTE — Telephone Encounter (Signed)
Called patient to inform of CT for 09-13-22- arrival time- 12:15 pm @ [facility], no restrictions to scan, patient to receive results from Dr. Roselind Messier on 09/16/22 @ 11 am, lvm for a return call

## 2022-08-24 ENCOUNTER — Telehealth: Payer: Self-pay | Admitting: *Deleted

## 2022-08-24 NOTE — Telephone Encounter (Signed)
RETURNED PATIENT'S RELATIVE'S PHONE CALL (MS. DONAHUE), LVM FOR A RETURN CALL

## 2022-09-13 ENCOUNTER — Ambulatory Visit (HOSPITAL_COMMUNITY)
Admission: RE | Admit: 2022-09-13 | Discharge: 2022-09-13 | Disposition: A | Discharge status: Home / Self Care | Payer: Medicare Other | Source: Ambulatory Visit | Attending: Radiation Oncology | Admitting: Radiation Oncology

## 2022-09-13 DIAGNOSIS — C3491 Malignant neoplasm of unspecified part of right bronchus or lung: Secondary | ICD-10-CM | POA: Diagnosis present

## 2022-09-15 NOTE — Progress Notes (Signed)
Radiation Oncology         [phone] ________________________________  Name: Jillian Scott MRN: [id number]  Date: 09/16/2022  DOB: 22-Mar-1935  Follow-Up Visit Note  CC: Patient, No Pcp Per  Audie Box L, DO  No diagnosis found.  Diagnosis: Stage I non-small cell carcinoma of the right upper lobe   Interval Since Last Radiation: 2 years, 5 months, and 4 days   Intent: Curative Radiation Treatment Dates: 04/03/2020 through 04/10/2020 Site: Right lung Technique: SBRT Total Dose (Gy): 54/54 Dose per Fx (Gy): 18 Completed Fx: 3/3 Beam Energies: 6XFFF  Narrative:  The patient returns today for routine follow-up and to review recent imaging. She was last seen here for follow-up on 03/15/22.    Her most recent chest CT without contrast on 09/13/22 demonstrates stable post treatment changes in the RUL without evidence of recurrent lung cancer or metastatic adenopathy.       Since her last visit, the patient also presented to the ED on 05/06/22 with c/o abdominal pain, nausea, and constipation. CT AP performed showed concern for fecal impaction in the sigmoid colon. No other acute findings or evidence of metastatic disease were appreciated. She also endorsed some mild SOB and was noted to desaturate to 83% on room air upon walking to the bathroom. A chest x-ray was subsequently performed which showed stable post treatment changes in the RUL and no acute findings overall. She shortness of breath and hypoxia was attributed to the fecal impaction putting pressure of her diaphragm and she was placed on 2L [place]. ED course ultimately included enema and manual disimpaction which relief achieved. Her hypoxia and shortness of breath also resolved after disimpaction.            No other significant interval history since she was last seen for follow-up.        ***  Allergies:  has No Known Allergies.  Meds: Current Outpatient Medications  Medication Sig Dispense Refill   acetaminophen  (TYLENOL) 500 MG tablet Take 1,000 mg by mouth every 6 (six) hours as needed for mild pain.     albuterol (VENTOLIN HFA) 108 (90 Base) MCG/ACT inhaler Inhale 2 puffs into the lungs every 6 (six) hours as needed for wheezing or shortness of breath.     aspirin EC 81 MG tablet Take 81 mg by mouth daily. Swallow whole.     Cholecalciferol (VITAMIN D3) 10 MCG (400 UNIT) CAPS Take 400 Units by mouth daily.     cyanocobalamin 1000 MCG tablet Take 1,000 mcg by mouth daily.     donepezil (ARICEPT) 5 MG tablet Take 5 mg by mouth at bedtime.     levothyroxine (SYNTHROID) 50 MCG tablet Take 50 mcg by mouth daily.     polyethylene glycol (MIRALAX / GLYCOLAX) 17 g packet Take 17 g by mouth daily. 14 each 0   rosuvastatin (CRESTOR) 10 MG tablet Take 10 mg by mouth daily.     No current facility-administered medications for this encounter.    Physical Findings: The patient is in no acute distress. Patient is alert and oriented.  vitals were not taken for this visit. .  No significant changes. Lungs are clear to auscultation bilaterally. Heart has regular rate and rhythm. No palpable cervical, supraclavicular, or axillary adenopathy. Abdomen soft, non-tender, normal bowel sounds.   Lab Findings: Lab Results  Component Value Date   WBC 7.0 05/06/2022   HGB 12.1 05/06/2022   HCT 39.9 05/06/2022   MCV 95.9 05/06/2022   PLT  227 05/06/2022    Radiographic Findings: CT CHEST WO CONTRAST  Result Date: 09/15/2022 CLINICAL DATA:  Non-small cell lung cancer. Assess treatment response. * Tracking Code: BO * EXAM: CT CHEST WITHOUT CONTRAST TECHNIQUE: Multidetector CT imaging of the chest was performed following the standard protocol without IV contrast. RADIATION DOSE REDUCTION: This exam was performed according to the departmental dose-optimization program which includes automated exposure control, adjustment of the mA and/or kV according to patient size and/or use of iterative reconstruction technique.  COMPARISON:  None Available. FINDINGS: Cardiovascular: No acute vascular findings. Normal heart size. No pericardial effusion. Coronary artery calcification and aortic atherosclerotic calcification. Mediastinum/Nodes: No axillary or supraclavicular adenopathy. No mediastinal or hilar adenopathy. No pericardial fluid. Esophagus normal. Lungs/Pleura: Band of consolidation in the RIGHT upper lobe extending from the hila and pleural surface is unchanged. There is central fiducial markers within the band of consolidation. Mild bronchiectasis. No new or suspicious pulmonary nodularity. Bilateral apical scarring is unchanged. Upper Abdomen: Limited view of the liver, kidneys, pancreas are unremarkable. Normal adrenal glands. Benign cyst within the posterior RIGHT hepatic lobe. Musculoskeletal: No chest wall mass or suspicious bone lesions identified. Very little body fat in the torso. IMPRESSION: 1. Stable post treatment consolidation in the RIGHT upper lobe. 2. No evidence of recurrent lung cancer. 3. No evidence of metastatic adenopathy. 4.  Aortic Atherosclerosis (ICD10-I70.0). Electronically Signed   By: Genevive Bi M.D.   On: 09/15/2022 10:07    Impression: Stage I non-small cell carcinoma of the right upper lobe   The patient is recovering from the effects of radiation.  ***  Plan:  ***   *** minutes of total time was spent for this patient encounter, including preparation, face-to-face counseling with the patient and coordination of care, physical exam, and documentation of the encounter. ____________________________________  Billie Lade, PhD, MD  This document serves as a record of services personally performed by Antony Blackbird, MD. It was created on his behalf by Neena Rhymes, a trained medical scribe. The creation of this record is based on the scribe's personal observations and the provider's statements to them. This document has been checked and approved by the attending provider.

## 2022-09-16 ENCOUNTER — Ambulatory Visit
Admission: RE | Admit: 2022-09-16 | Discharge: 2022-09-16 | Disposition: A | Discharge status: Home / Self Care | Payer: Medicare Other | Source: Ambulatory Visit | Attending: Radiation Oncology | Admitting: Radiation Oncology

## 2022-09-16 ENCOUNTER — Telehealth: Payer: Self-pay | Admitting: *Deleted

## 2022-09-16 ENCOUNTER — Encounter: Payer: Self-pay | Admitting: Radiation Oncology

## 2022-09-16 VITALS — BP 110/67 | HR 82 | Temp 97.8°F | Resp 20 | Ht 63.0 in

## 2022-09-16 DIAGNOSIS — I700 Atherosclerosis of aorta: Secondary | ICD-10-CM | POA: Diagnosis not present

## 2022-09-16 DIAGNOSIS — Z79890 Hormone replacement therapy (postmenopausal): Secondary | ICD-10-CM | POA: Diagnosis not present

## 2022-09-16 DIAGNOSIS — Z923 Personal history of irradiation: Secondary | ICD-10-CM | POA: Diagnosis not present

## 2022-09-16 DIAGNOSIS — J479 Bronchiectasis, uncomplicated: Secondary | ICD-10-CM | POA: Diagnosis not present

## 2022-09-16 DIAGNOSIS — R0602 Shortness of breath: Secondary | ICD-10-CM | POA: Diagnosis not present

## 2022-09-16 DIAGNOSIS — Z85118 Personal history of other malignant neoplasm of bronchus and lung: Secondary | ICD-10-CM | POA: Diagnosis present

## 2022-09-16 DIAGNOSIS — Z7982 Long term (current) use of aspirin: Secondary | ICD-10-CM | POA: Diagnosis not present

## 2022-09-16 DIAGNOSIS — Z79899 Other long term (current) drug therapy: Secondary | ICD-10-CM | POA: Insufficient documentation

## 2022-09-16 DIAGNOSIS — C3411 Malignant neoplasm of upper lobe, right bronchus or lung: Secondary | ICD-10-CM

## 2022-09-16 DIAGNOSIS — C3491 Malignant neoplasm of unspecified part of right bronchus or lung: Secondary | ICD-10-CM

## 2022-09-16 NOTE — Telephone Encounter (Signed)
xxxx 

## 2022-09-16 NOTE — Telephone Encounter (Signed)
Called patient's relative- Trish Fountain and informed of appt. with nurse practitioner Rikki Spearing for 10-20-22- arrival time- 10:45 am, address- [address], phone number- [phone], spoke with Ms. Sandy Salaam and she is aware of this appt.

## 2022-09-16 NOTE — Progress Notes (Signed)
Jarya Jurs is here today for follow up post radiation to the lung.  Lung Side: Right, patient completed  treatment on 04/10/20.  Does the patient complain of any of the following: Pain:No Shortness of breath w/wo exertion:  Yes, worsened shortness of breath. Patient requesting to be started on oxygen.  Cough: Yes, dry cough Hemoptysis: No Pain with swallowing: No Swallowing/choking concerns: No Appetite: Fair Energy Level: Fair Post radiation skin Changes: No    Additional comments if applicable:   BP 110/67 (BP Location: Left Arm, Patient Position: Sitting, Cuff Size: Normal)   Pulse 82   Temp 97.8 F (36.6 C)   Resp 20   Ht 5\' 3"  (1.6 m)   SpO2 98%   BMI 18.07 kg/m

## 2022-10-20 ENCOUNTER — Ambulatory Visit: Payer: Medicare Other | Admitting: Adult Health

## 2022-10-20 ENCOUNTER — Encounter: Payer: Self-pay | Admitting: Adult Health

## 2022-10-20 VITALS — BP 142/84 | HR 76 | Temp 98.2°F | Ht 63.0 in | Wt 91.8 lb

## 2022-10-20 DIAGNOSIS — R0902 Hypoxemia: Secondary | ICD-10-CM

## 2022-10-20 DIAGNOSIS — C3491 Malignant neoplasm of unspecified part of right bronchus or lung: Secondary | ICD-10-CM | POA: Diagnosis not present

## 2022-10-20 DIAGNOSIS — R0609 Other forms of dyspnea: Secondary | ICD-10-CM

## 2022-10-20 DIAGNOSIS — J9611 Chronic respiratory failure with hypoxia: Secondary | ICD-10-CM

## 2022-10-20 LAB — CBC WITH DIFFERENTIAL/PLATELET
Basophils Absolute: 0.1 10*3/uL (ref 0.0–0.1)
Basophils Relative: 1.2 % (ref 0.0–3.0)
Eosinophils Absolute: 0 10*3/uL (ref 0.0–0.7)
Eosinophils Relative: 0.7 % (ref 0.0–5.0)
HCT: 37.8 % (ref 36.0–46.0)
Hemoglobin: 11.9 g/dL — ABNORMAL LOW (ref 12.0–15.0)
Lymphocytes Relative: 11.7 % — ABNORMAL LOW (ref 12.0–46.0)
Lymphs Abs: 0.7 10*3/uL (ref 0.7–4.0)
MCHC: 31.4 g/dL (ref 30.0–36.0)
MCV: 91.8 fL (ref 78.0–100.0)
Monocytes Absolute: 0.4 10*3/uL (ref 0.1–1.0)
Monocytes Relative: 7.8 % (ref 3.0–12.0)
Neutro Abs: 4.5 10*3/uL (ref 1.4–7.7)
Neutrophils Relative %: 78.6 % — ABNORMAL HIGH (ref 43.0–77.0)
Platelets: 279 10*3/uL (ref 150.0–400.0)
RBC: 4.12 Mil/uL (ref 3.87–5.11)
RDW: 15.3 % (ref 11.5–15.5)
WBC: 5.7 10*3/uL (ref 4.0–10.5)

## 2022-10-20 LAB — BASIC METABOLIC PANEL
BUN: 11 mg/dL (ref 6–23)
CO2: 38 meq/L — ABNORMAL HIGH (ref 19–32)
Calcium: 9.2 mg/dL (ref 8.4–10.5)
Chloride: 100 meq/L (ref 96–112)
Creatinine, Ser: 0.61 mg/dL (ref 0.40–1.20)
GFR: 81.68 mL/min (ref >60.00)
Glucose, Bld: 92 mg/dL (ref 70–99)
Potassium: 3.6 meq/L (ref 3.5–5.1)
Sodium: 145 meq/L (ref 135–145)

## 2022-10-20 LAB — D-DIMER, QUANTITATIVE: D-Dimer, Quant: 0.73 ug{FEU}/mL — ABNORMAL HIGH (ref <0.50)

## 2022-10-20 LAB — BRAIN NATRIURETIC PEPTIDE: Pro B Natriuretic peptide (BNP): 49 pg/mL (ref 0.0–100.0)

## 2022-10-20 NOTE — Assessment & Plan Note (Signed)
Patient with progressive dyspnea and exertional hypoxemia questionable etiology.  Patient is a never smoker.  Has no previous diagnosis of COPD or asthma.  No perceived benefit on albuterol.  Has no significant cough or wheezing.  Exam shows bilateral lower extremity edema.  She does have desaturations with ambulation requiring oxygen.  Will check lab works including CBC, BNP, D-dimer.  If D-dimer is positive we will need to proceed with a CTA angio to rule out VTE. Will check a 2D echo as may have a component of congestive heart failure. Hold on diuretics at this time she does have lower extremity edema but does not appear to be acutely fluid overloaded.  Plan  Patient Instructions  Begin Oxygen 2l/m with activity .  Set up for Overnight oximetry today .  Set up for 2 D echo .  Labs today .  Follow up in 3-4 weeks with Dr. Vassie Loll  or Teigen Parslow NP  Please contact office for sooner follow up if symptoms do not improve or worsen or seek emergency care

## 2022-10-20 NOTE — Patient Instructions (Addendum)
Begin Oxygen 2l/m with activity .  Set up for Overnight oximetry today .  Set up for 2 D echo .  Labs today .  Follow up in 3-4 weeks with Dr. Vassie Loll  or Jaileigh Weimer NP  Please contact office for sooner follow up if symptoms do not improve or worsen or seek emergency care

## 2022-10-20 NOTE — Assessment & Plan Note (Signed)
Hypermetabolic right upper lobe lung nodule with non-small cell lung cancer on cytology -she was considered not a good surgical candidate.  She underwent SBRT March 2022.  Serial CT follow-up is showed no evidence of disease recurrence. Continue follow-up with radiation oncology  Plan Patient Instructions  Begin Oxygen 2l/m with activity .  Set up for Overnight oximetry today .  Set up for 2 D echo .  Labs today .  Follow up in 3-4 weeks with Dr. Vassie Loll  or Abril Cappiello NP  Please contact office for sooner follow up if symptoms do not improve or worsen or seek emergency care

## 2022-10-20 NOTE — Progress Notes (Signed)
@Patient  ID: Jillian Scott, female    DOB: [birth year], 87 y.o.   MRN: [id number]  Chief Complaint  Patient presents with   Follow-up    Referring provider: Ammie Ferrier  HPI: 87 year old female never smoker followed for hypermetabolic RUL lung nodule that was positive for NSCLC s/p SBRT (03/2020)   TEST/EVENTS :  PET 01/2020 >> Persistent increased metabolic activity in the spiculated pulmonary nodule SUV 2.2  , Rt hilum ,RIGHT maxillary sinus. 09/2019 CT chest WO con >> 13 x 9 x 17 mm spiculated nodular density in the RIGHT upper lobe, previously 13 x 8 x 16 mm. This measured 12 x 8 x 12 mm on 01/09/2018.   03/2019 CT chest Stable spiculated right upper lobe nodule 12.8 mm   12/2017 CT chest/abdomen/pelvis with contrast - 4 cm  benign pericardial cyst.  It also showed a 14 mm spiculated nodule in the right upper lobe  Chest CT without contrast on 09/13/22 demonstrates stable post treatment changes in the RUL without evidence of recurrent lung cancer or metastatic adenopathy.   FOB cytology 02/2020 + malignant cell -[facility]   10/20/2022 Follow up: Lung cancer and Dyspnea  Returns for follow up, last seen in 2022.  She has previously been seen for a hypermetabolic lung nodule suspicious for lung cancer status post SBRT.  She was considered not a good surgical candidate.  She is followed up with radiation oncology.  Most recent serial CT chest September 13, 2022 showed stable posttreatment changes in the right upper lobe without evidence of recurrent lung cancer or metastatic adenopathy.   Patient is a never smoker.  Has had heavy secondhand smoke exposure.  She has no previous diagnosis of asthma or COPD.  Patient does complain of increased shortness of breath over the last several months.  She gets winded with minimal activities.  Is also noticed some increased ankle edema over the last several months bilaterally.  Has been checking oxygen levels at home and noticing that her oxygen levels  have been dropping down into the 80s.  Today in the office walk test shows O2 saturations at 83% on room air with ambulation.  Required 2 L of oxygen to maintain O2 saturation greater than 88 to 90%.  Patient is very frail and cannot lift heavy tanks.  She was walked on a POC device today in the office and was able to maintain O2 saturations at 90% on 2 L of pulsed oxygen.  She denies any hemoptysis, chest pain, orthopnea or calf pain.  No history of VTE.  She denies any significant cough.  Has been given albuterol inhaler in the past without any significant perceived benefit. She has mild dementia , caregiver Jillian Scott is with her today.     No Known Allergies  Immunization History  Administered Date(s) Administered   PFIZER(Purple Top)SARS-COV-2 Vaccination 04/05/2019, 04/26/2019   Zoster Recombinant(Shingrix) 08/14/2018    Past Medical History:  Diagnosis Date   COPD (chronic obstructive pulmonary disease) (HCC)    Dyspnea    History of radiation therapy 04/03/2020, 04/08/2020, 04/10/2020   Right Lung- SBRT Dr.  Antony Blackbird   Hyperlipidemia    Hypothyroidism    Maxillary sinus mass     Tobacco History: Social History   Tobacco Use  Smoking Status Never  Smokeless Tobacco Never   Counseling given: Not Answered   Outpatient Medications Prior to Visit  Medication Sig Dispense Refill   acetaminophen (TYLENOL) 500 MG tablet Take 1,000 mg by mouth every  6 (six) hours as needed for mild pain.     albuterol (VENTOLIN HFA) 108 (90 Base) MCG/ACT inhaler Inhale 2 puffs into the lungs every 6 (six) hours as needed for wheezing or shortness of breath.     aspirin EC 81 MG tablet Take 81 mg by mouth daily. Swallow whole.     Cholecalciferol (VITAMIN D3) 10 MCG (400 UNIT) CAPS Take 400 Units by mouth daily.     cyanocobalamin 1000 MCG tablet Take 1,000 mcg by mouth daily.     donepezil (ARICEPT) 5 MG tablet Take 5 mg by mouth at bedtime.     levothyroxine (SYNTHROID) 50 MCG tablet Take 50  mcg by mouth daily.     polyethylene glycol (MIRALAX / GLYCOLAX) 17 g packet Take 17 g by mouth daily. 14 each 0   rosuvastatin (CRESTOR) 10 MG tablet Take 10 mg by mouth daily.     No facility-administered medications prior to visit.     Review of Systems:   Constitutional:   No  weight loss, night sweats,  Fevers, chills,  +fatigue, or  lassitude.  HEENT:   No headaches,  Difficulty swallowing,  Tooth/dental problems, or  Sore throat,                No sneezing, itching, ear ache, nasal congestion, post nasal drip,   CV:  No chest pain,  Orthopnea, PND, swelling in lower extremities, anasarca, dizziness, palpitations, syncope.   GI  No heartburn, indigestion, abdominal pain, nausea, vomiting, diarrhea, change in bowel habits, loss of appetite, bloody stools.   Resp:  No chest wall deformity  Skin: no rash or lesions.  GU: no dysuria, change in color of urine, no urgency or frequency.  No flank pain, no hematuria   MS:  No joint pain or swelling.  No decreased range of motion.  No back pain.    Physical Exam  BP (!) 142/84 (BP Location: Left Arm, Cuff Size: Small)   Pulse 76   Temp 98.2 F (36.8 C) (Temporal)   Ht 5\' 3"  (1.6 m)   Wt 91 lb 12.8 oz (41.6 kg)   SpO2 96%   BMI 16.26 kg/m   GEN: A/Ox3; pleasant , NAD, frail and elderly    HEENT:  [place]/AT,   NOSE-clear, THROAT-clear, no lesions, no postnasal drip or exudate noted.   NECK:  Supple w/ fair ROM; no JVD; normal carotid impulses w/o bruits; no thyromegaly or nodules palpated; no lymphadenopathy.    RESP  Clear  P & A; w/o, wheezes/ rales/ or rhonchi. no accessory muscle use, no dullness to percussion  CARD:  RRR, no m/r/g, no peripheral edema, pulses intact, no cyanosis or clubbing.  GI:   Soft & nt; nml bowel sounds; no organomegaly or masses detected.   Musco: Warm bil, no deformities or joint swelling noted.   Neuro: alert, no focal deficits noted.    Skin: Warm, no lesions or rashes    Lab  Results:  CBC    Component Value Date/Time   WBC 7.0 05/06/2022 0819   RBC 4.16 05/06/2022 0819   HGB 12.1 05/06/2022 0819   HCT 39.9 05/06/2022 0819   PLT 227 05/06/2022 0819   MCV 95.9 05/06/2022 0819   MCH 29.1 05/06/2022 0819   MCHC 30.3 05/06/2022 0819   RDW 14.6 05/06/2022 0819   LYMPHSABS 0.9 05/06/2022 0819   MONOABS 0.5 05/06/2022 0819   EOSABS 0.0 05/06/2022 0819   BASOSABS 0.0 05/06/2022 0819  BMET    Component Value Date/Time   NA 140 05/06/2022 0819   K 4.1 05/06/2022 0819   CL 99 05/06/2022 0819   CO2 36 (H) 05/06/2022 0819   GLUCOSE 91 05/06/2022 0819   BUN 11 05/06/2022 0819   CREATININE 0.67 05/06/2022 0819   CALCIUM 9.4 05/06/2022 0819   GFRNONAA >60 05/06/2022 0819   GFRAA >60 01/09/2018 0952    BNP    Component Value Date/Time   BNP 14.7 07/23/2020 0939    ProBNP No results found for: "PROBNP"  Imaging: No results found.  Administration History     None           No data to display          No results found for: "NITRICOXIDE"      Assessment & Plan:   Non-small cell lung cancer (HCC) Hypermetabolic right upper lobe lung nodule with non-small cell lung cancer on cytology -she was considered not a good surgical candidate.  She underwent SBRT March 2022.  Serial CT follow-up is showed no evidence of disease recurrence. Continue follow-up with radiation oncology  Plan Patient Instructions  Begin Oxygen 2l/m with activity .  Set up for Overnight oximetry today .  Set up for 2 D echo .  Labs today .  Follow up in 3-4 weeks with Dr. Vassie Loll  or Malorie Bigford NP  Please contact office for sooner follow up if symptoms do not improve or worsen or seek emergency care        Hypoxia Patient with progressive dyspnea and exertional hypoxemia questionable etiology.  Patient is a never smoker.  Has no previous diagnosis of COPD or asthma.  No perceived benefit on albuterol.  Has no significant cough or wheezing.  Exam shows bilateral  lower extremity edema.  She does have desaturations with ambulation requiring oxygen.  Will check lab works including CBC, BNP, D-dimer.  If D-dimer is positive we will need to proceed with a CTA angio to rule out VTE. Will check a 2D echo as may have a component of congestive heart failure. Hold on diuretics at this time she does have lower extremity edema but does not appear to be acutely fluid overloaded.  Plan  Patient Instructions  Begin Oxygen 2l/m with activity .  Set up for Overnight oximetry today .  Set up for 2 D echo .  Labs today .  Follow up in 3-4 weeks with Dr. Vassie Loll  or Tanush Drees NP  Please contact office for sooner follow up if symptoms do not improve or worsen or seek emergency care         I spent  40  minutes dedicated to the care of this patient on the date of this encounter to include pre-visit review of records, face-to-face time with the patient discussing conditions above, post visit ordering of testing, clinical documentation with the electronic health record, making appropriate referrals as documented, and communicating necessary findings to members of the patients care team.   Rubye Oaks, NP 10/20/2022

## 2022-10-21 ENCOUNTER — Ambulatory Visit (HOSPITAL_COMMUNITY): Admission: RE | Admit: 2022-10-21 | Payer: Medicare Other | Source: Ambulatory Visit

## 2022-10-21 NOTE — Addendum Note (Signed)
Addended by: Julio Sicks on: 10/21/2022 10:14 AM   Modules accepted: Orders

## 2022-10-22 ENCOUNTER — Telehealth: Payer: Self-pay | Admitting: Adult Health

## 2022-10-22 NOTE — Telephone Encounter (Signed)
Patient missed her CT at [facility] for October 3rd,2024. Her relative is calling to get that rescheduled. Please call and advise. [phone]

## 2022-10-22 NOTE — Telephone Encounter (Signed)
Lmam for the patients family to call [phone] to get this resc

## 2022-10-25 NOTE — Telephone Encounter (Signed)
I tried to reach patient all day Thursday and Friday of last week and have already called again this morning (10/7). I keep leaving voicemails with my direct phone number so I can attempt to determine when they would be able to bring patient in for CT. As of 09:07am on 10/25/2022 I have not yet received a phone call back. Tammy has been updated.

## 2022-10-26 ENCOUNTER — Encounter: Payer: Self-pay | Admitting: *Deleted

## 2022-10-26 NOTE — Progress Notes (Signed)
ATC x2.  LVM to return call.  Mychart message sent as well.

## 2022-10-28 ENCOUNTER — Encounter: Payer: Self-pay | Admitting: *Deleted

## 2022-10-28 NOTE — Progress Notes (Signed)
Unable to reach letter sent to patient

## 2022-10-30 ENCOUNTER — Encounter: Payer: Self-pay | Admitting: *Deleted

## 2022-10-31 IMAGING — PT NM PET TUM IMG RESTAG (PS) SKULL BASE T - THIGH
1 series · 4 of 4 positions shown · non-contrast
Comparison: Multiple prior studies most recently from October 05, 2019.

CLINICAL DATA: Subsequent treatment strategy for spiculated lung
nodule in a 84-year-old female.

EXAM:
NUCLEAR MEDICINE PET SKULL BASE TO THIGH
TECHNIQUE: 5.5 mCi F-18 FDG was injected intravenously. Full-ring PET imaging
was performed from the skull base to thigh after the radiotracer. CT
data was obtained and used for attenuation correction and anatomic
localization.
Fasting blood glucose: 89 mg/dl

[Series 1266: results mm oncology reading · 1.0mm · 0.46mm/px · 4 of 4 slices shown]
[im 1/4]
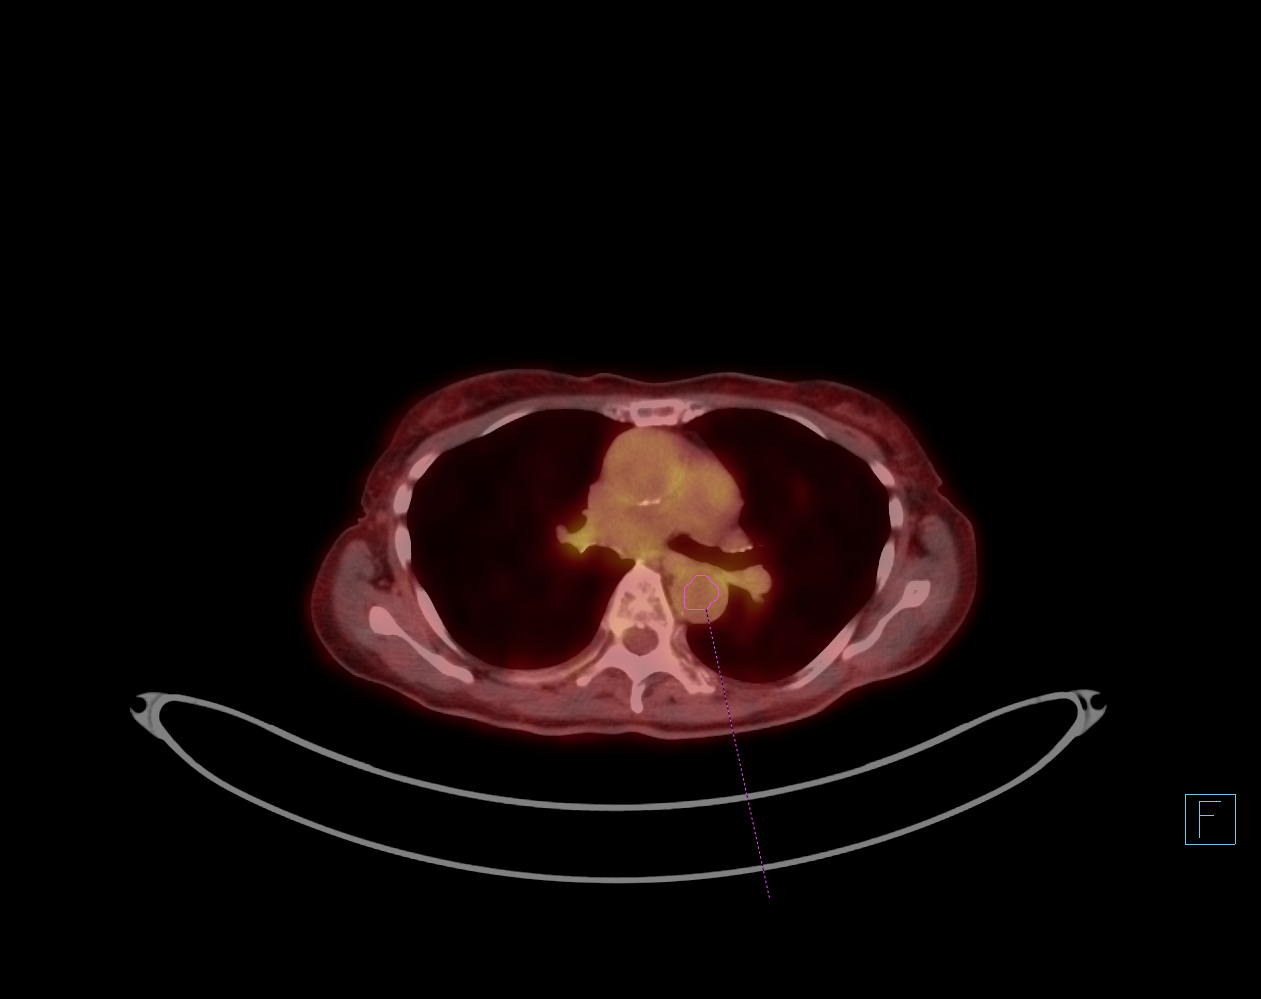
[im 2/4]
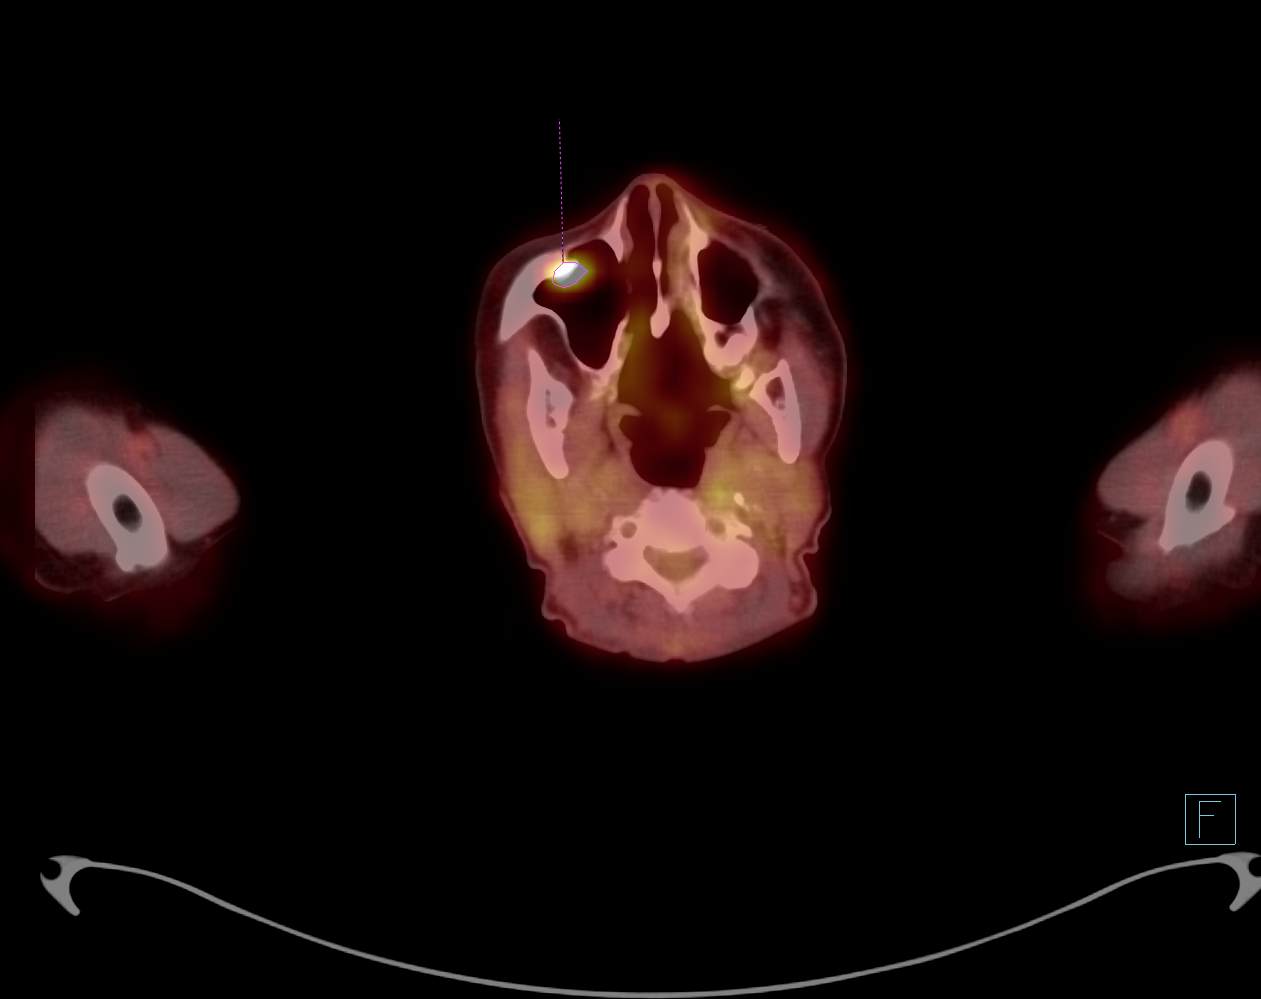
[im 3/4]
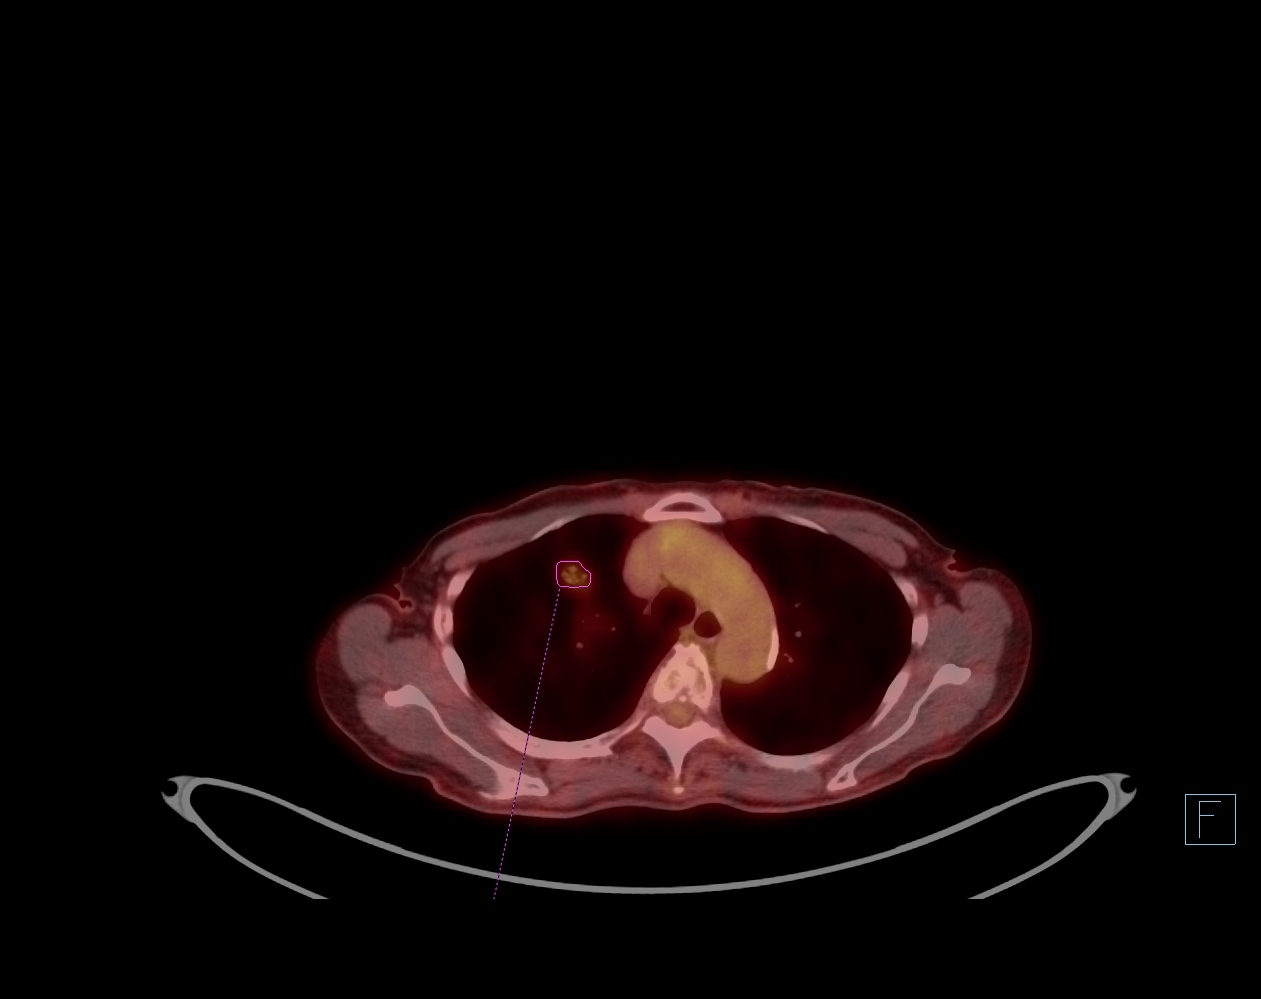
[im 4/4]
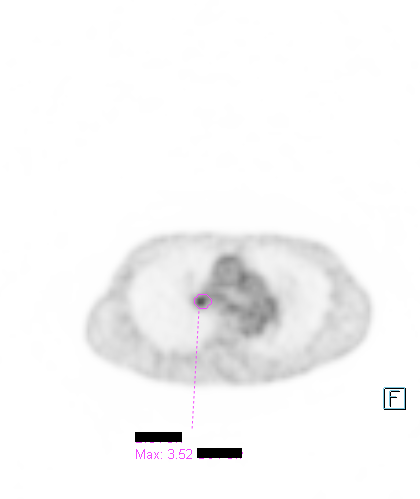

[4 of 4 positions shown; findings below may reference images not displayed]

FINDINGS: Mediastinal blood pool activity: SUV max

Liver activity: SUV max NA

NECK: Area of increased uptake in the RIGHT maxillary sinus is
diminished is markedly diminished in size. There is thickening of
the bone along the anterior margin of the RIGHT maxillary sinus with
very small residual soft tissue density on image 20 of series 4.
This area measuring 12 x 5 mm. Corresponding uptake in this location
with a maximum SUV of 9.5 as compared to as much as 22 on the prior
study. No signs of hypermetabolic lymph nodes in the neck

Incidental CT findings: none

CHEST: Spiculated RIGHT upper lobe nodule measuring approximately
1.3 cm greatest axial dimension, slowly enlarging over time as
compared to prior studies as outlined in the recent CT evaluation
from Thursday September, 2019 (image 56, series 4) (SUVmax = 2.2,
previously 2.3)

Image 64 of series 3 and series 4 showing a maximum SUV of
identical to low level uptake in this location on the previous
study. Due to less background this appears more conspicuous on the
current exam. No CT correlate in this location

Mildly hypermetabolic focus near the lateral aspect of the LEFT
second rib on image 37 of series 4 within the fat without imaging
correlate on CT.

Incidental CT findings: Aortic atherosclerosis. Dilated ascending
thoracic aorta similar to previous [HOSPITAL] approximately 3.7 cm.
Calcified coronary artery disease and aortic valvular
calcifications. Mildly patulous esophagus.

ABDOMEN/PELVIS: No abnormal hypermetabolic activity within the
liver, pancreas, adrenal glands, or spleen. No hypermetabolic lymph
nodes in the abdomen or pelvis.

Incidental CT findings: Aortic atherosclerosis of the abdominal
aorta. Low-density lesion in the posterior RIGHT hemi liver
unchanged and without metabolic activity, likely a small cyst. Signs
of colonic diverticulosis. No acute gastrointestinal process.

SKELETON: No focal hypermetabolic activity to suggest skeletal
metastasis.

Incidental CT findings: Spinal degenerative changes. No acute or
destructive bone process.
IMPRESSION: Persistent low level uptake in the slowly enlarging spiculated
pulmonary nodule, this remains concerning for indolent bronchogenic
neoplasm but activity remains similar to previous PET imaging.

Persistent increased metabolic activity in the RIGHT maxillary
sinus. Soft tissue in this location wall markedly diminished is
noted along the anterior wall of the maxillary sinus. Residual or
recurrence sinonasal papilloma is considered. ENT follow-up with
direct visualization is suggested.

Activity about the RIGHT hilum, nonspecific and without CT
correlate. This may represent a small reactive lymph node or even
variation and blood pool in this location. Could consider CT of the
chest with intravenous contrast for further assessment as clinically
warranted.

Asymmetric uptake in the supraclavicular fossa on the LEFT is likely
muscular or brown fat activity with asymmetry. There are subtle
areas of uptake noted on the RIGHT with similar distribution when
viewed in the coronal plane.

Aortic Atherosclerosis ([id number]).

## 2022-11-16 ENCOUNTER — Ambulatory Visit: Payer: Medicare Other | Admitting: Adult Health

## 2023-03-14 ENCOUNTER — Encounter (HOSPITAL_COMMUNITY): Payer: Self-pay

## 2023-03-14 ENCOUNTER — Ambulatory Visit (HOSPITAL_COMMUNITY)
Admission: RE | Admit: 2023-03-14 | Discharge: 2023-03-14 | Disposition: A | Discharge status: Home / Self Care | Payer: Medicare Other | Source: Ambulatory Visit | Attending: Radiology | Admitting: Radiology

## 2023-03-14 DIAGNOSIS — C3411 Malignant neoplasm of upper lobe, right bronchus or lung: Secondary | ICD-10-CM | POA: Insufficient documentation

## 2023-03-14 HISTORY — DX: Malignant (primary) neoplasm, unspecified: C80.1

## 2023-03-21 ENCOUNTER — Encounter: Payer: Self-pay | Admitting: *Deleted

## 2023-03-21 ENCOUNTER — Telehealth: Payer: Self-pay | Admitting: *Deleted

## 2023-03-21 NOTE — Telephone Encounter (Signed)
 ATC patient, LVM to return call.  When she returns call, I am trying to find out whether she had her echo done.  She has not had her ONO completed (I clarified this with Brad at [employer]).  Mychart message sent as well.

## 2023-03-22 ENCOUNTER — Ambulatory Visit: Payer: Medicare Other | Admitting: Adult Health

## 2023-03-22 NOTE — Progress Notes (Signed)
 Jillian Scott is here today for follow up post radiation to the lung.  Lung Side:  Right Upper Lobe, last treatment was 04/10/2020.  Does the patient complain of any of the following: Pain:*** Shortness of breath w/wo exertion: *** Cough: *** Hemoptysis: *** Pain with swallowing: *** Swallowing/choking concerns: *** Appetite: *** Energy Level: *** Post radiation skin Changes: ***    Additional comments if applicable:

## 2023-03-23 NOTE — Progress Notes (Signed)
 Radiation Oncology         [phone] ________________________________  Name: Jillian Scott MRN: [id number]  Date: 03/24/2023  DOB: [birth year]  Follow-Up Visit Note  CC: Patient, No Pcp Per  Audie Box L, DO  No diagnosis found.  Diagnosis:  Stage I non-small cell carcinoma of the right upper lobe    Interval Since Last Radiation: 2 years, 11 months, and 11 days    Intent: Curative  Radiation Treatment Dates: 04/03/2020 through 04/10/2020  Site: Right lung Technique: SBRT Total Dose (Gy): 54/54 Dose per Fx (Gy): 18 Completed Fx: 3/3 Beam Energies: 6XFFF   Narrative:  The patient returns today for routine follow-up and to review most recent imaging. She was last seen in office on 09-16-22 for a routine follow up.   Since then, she followed up with NP Parrett on 10-20-22 with complains of increased shortness of breath. Her oxygen levels at home had been dropping down into the 80s. At that time, she was started on oxygen 2 l/m. Echo was scheduled but never done.   Most recent CT chest sone on 03-14-23 showed stable findings with no evidence of r recurrent or metastatic disease in the chest.        No other significant oncologic interval history since the patient was last seen.                          Allergies:  has no known allergies.  Meds: Current Outpatient Medications  Medication Sig Dispense Refill   acetaminophen (TYLENOL) 500 MG tablet Take 1,000 mg by mouth every 6 (six) hours as needed for mild pain.     albuterol (VENTOLIN HFA) 108 (90 Base) MCG/ACT inhaler Inhale 2 puffs into the lungs every 6 (six) hours as needed for wheezing or shortness of breath.     aspirin EC 81 MG tablet Take 81 mg by mouth daily. Swallow whole.     Cholecalciferol (VITAMIN D3) 10 MCG (400 UNIT) CAPS Take 400 Units by mouth daily.     cyanocobalamin 1000 MCG tablet Take 1,000 mcg by mouth daily.     donepezil (ARICEPT) 5 MG tablet Take 5 mg by mouth at bedtime.     levothyroxine  (SYNTHROID) 50 MCG tablet Take 50 mcg by mouth daily.     polyethylene glycol (MIRALAX / GLYCOLAX) 17 g packet Take 17 g by mouth daily. 14 each 0   rosuvastatin (CRESTOR) 10 MG tablet Take 10 mg by mouth daily.     No current facility-administered medications for this encounter.    Physical Findings: The patient is in no acute distress. Patient is alert and oriented.  vitals were not taken for this visit. .  No significant changes. Lungs are clear to auscultation bilaterally. Heart has regular rate and rhythm. No palpable cervical, supraclavicular, or axillary adenopathy. Abdomen soft, non-tender, normal bowel sounds.   Lab Findings: Lab Results  Component Value Date   WBC 5.7 10/20/2022   HGB 11.9 (L) 10/20/2022   HCT 37.8 10/20/2022   MCV 91.8 10/20/2022   PLT 279.0 10/20/2022    Radiographic Findings: CT Chest Wo Contrast Result Date: 03/23/2023 CLINICAL DATA:  Non-small-cell lung cancer. Restaging. SIRT body on EXAM: CT CHEST WITHOUT CONTRAST TECHNIQUE: Multidetector CT imaging of the chest was performed following the standard protocol without IV contrast. RADIATION DOSE REDUCTION: This exam was performed according to the departmental dose-optimization program which includes automated exposure control, adjustment of the mA and/or kV  according to patient size and/or use of iterative reconstruction technique. COMPARISON:  09/13/2022 FINDINGS: Cardiovascular: The heart size is normal. No substantial pericardial effusion. Coronary artery calcification is evident. Mild atherosclerotic calcification is noted in the wall of the thoracic aorta. Ascending thoracic aorta measures up to 3.9 cm diameter. Mediastinum/Nodes: No mediastinal lymphadenopathy. No evidence for gross hilar lymphadenopathy although assessment is limited by the lack of intravenous contrast on the current study. The esophagus has normal imaging features. There is no axillary lymphadenopathy. Lungs/Pleura: Biapical  pleuroparenchymal scarring, similar to prior. Post treatment scarring in the anterior right upper lobe again noted without appreciable interval change. There is no new suspicious pulmonary nodule or mass. No focal airspace consolidation. There is no evidence of pleural effusion. Keep down stable small low-density lesion posterior right hepatic dome, likely a benign cyst. Upper Abdomen: Stable well-defined homogeneous low-density lesion posterior right hepatic dome, likely a cyst Musculoskeletal: No worrisome lytic or sclerotic osseous abnormality. IMPRESSION: 1. Stable exam. No evidence for recurrent or metastatic disease in the chest. 2. Aortic Atherosclerois (ICD10-170.0) Electronically Signed   By: Kennith Center M.D.   On: 03/23/2023 10:23    Impression:   Stage I non-small cell carcinoma of the right upper lobe   The patient is recovering from the effects of radiation.  ***  Plan:  ***   *** minutes of total time was spent for this patient encounter, including preparation, face-to-face counseling with the patient and coordination of care, physical exam, and documentation of the encounter. ____________________________________  Billie Lade, PhD, MD  This document serves as a record of services personally performed by Antony Blackbird, MD. It was created on his behalf by Herbie Saxon, a trained medical scribe. The creation of this record is based on the scribe's personal observations and the provider's statements to them. This document has been checked and approved by the attending provider.

## 2023-03-24 ENCOUNTER — Ambulatory Visit
Admission: RE | Admit: 2023-03-24 | Discharge: 2023-03-24 | Disposition: A | Discharge status: Home / Self Care | Payer: Medicare Other | Source: Ambulatory Visit | Attending: Radiation Oncology | Admitting: Radiation Oncology

## 2023-03-24 ENCOUNTER — Telehealth: Payer: Self-pay | Admitting: Radiation Oncology

## 2023-03-24 ENCOUNTER — Encounter: Payer: Self-pay | Admitting: Radiation Oncology

## 2023-03-24 VITALS — BP 155/86 | HR 78 | Temp 97.8°F | Resp 24 | Ht 63.0 in | Wt 91.2 lb

## 2023-03-24 DIAGNOSIS — C3411 Malignant neoplasm of upper lobe, right bronchus or lung: Secondary | ICD-10-CM

## 2023-03-24 DIAGNOSIS — C3491 Malignant neoplasm of unspecified part of right bronchus or lung: Secondary | ICD-10-CM

## 2023-03-24 NOTE — Telephone Encounter (Signed)
 Pt called stating she wasn't wanting to come in this morning but was willing to come in later in the day. Appt moved to 4pm today.

## 2023-03-24 NOTE — Addendum Note (Signed)
 Encounter addended by: Erven Colla, PA-C on: 03/24/2023 2:23 PM  Actions taken: Level of Service modified

## 2023-04-26 ENCOUNTER — Ambulatory Visit (HOSPITAL_COMMUNITY)
Admission: RE | Admit: 2023-04-26 | Discharge: 2023-04-26 | Disposition: A | Discharge status: Home / Self Care | Source: Ambulatory Visit | Attending: Adult Health | Admitting: Adult Health

## 2023-04-26 DIAGNOSIS — I080 Rheumatic disorders of both mitral and aortic valves: Secondary | ICD-10-CM | POA: Diagnosis not present

## 2023-04-26 DIAGNOSIS — E785 Hyperlipidemia, unspecified: Secondary | ICD-10-CM | POA: Diagnosis not present

## 2023-04-26 DIAGNOSIS — R0600 Dyspnea, unspecified: Secondary | ICD-10-CM | POA: Diagnosis present

## 2023-04-26 DIAGNOSIS — J9611 Chronic respiratory failure with hypoxia: Secondary | ICD-10-CM | POA: Insufficient documentation

## 2023-04-26 DIAGNOSIS — R0609 Other forms of dyspnea: Secondary | ICD-10-CM | POA: Diagnosis present

## 2023-04-26 LAB — ECHOCARDIOGRAM COMPLETE
AR max vel: 2.01 cm2
AV Area VTI: 1.8 cm2
AV Area mean vel: 1.73 cm2
AV Mean grad: 3 mmHg
AV Peak grad: 5.2 mmHg
Ao pk vel: 1.14 m/s
Area-P 1/2: 3.39 cm2
Calc EF: 54.4 %
S' Lateral: 2.8 cm
Single Plane A2C EF: 57.7 %
Single Plane A4C EF: 50.2 %

## 2023-04-28 ENCOUNTER — Telehealth: Payer: Self-pay | Admitting: Adult Health

## 2023-04-28 NOTE — Telephone Encounter (Signed)
 ONO 04/20/23 showed min desats on room air. 6 min out of 12 hr (tot time test). Baseline O2 92%.  No need for Oxygen At bedtime

## 2023-04-29 ENCOUNTER — Encounter: Payer: Self-pay | Admitting: *Deleted

## 2023-04-29 NOTE — Telephone Encounter (Signed)
 ATC x1.  LVM to return call.  Mychart message sent with results.

## 2023-05-04 ENCOUNTER — Encounter: Payer: Self-pay | Admitting: *Deleted

## 2023-05-04 NOTE — Telephone Encounter (Signed)
 ATC patient x2 to verify that patient received previous voice mail and [employer].

## 2023-05-05 ENCOUNTER — Encounter: Payer: Self-pay | Admitting: Adult Health

## 2023-05-05 NOTE — Telephone Encounter (Signed)
 Patient has a f/u in the office on 5/30.  Nothing further needed.

## 2023-06-17 ENCOUNTER — Ambulatory Visit: Admitting: Adult Health

## 2023-06-17 ENCOUNTER — Encounter: Payer: Self-pay | Admitting: Adult Health

## 2023-06-17 VITALS — BP 156/78 | HR 73 | Ht 63.0 in | Wt 90.4 lb

## 2023-06-17 DIAGNOSIS — C3491 Malignant neoplasm of unspecified part of right bronchus or lung: Secondary | ICD-10-CM

## 2023-06-17 DIAGNOSIS — R0902 Hypoxemia: Secondary | ICD-10-CM | POA: Diagnosis not present

## 2023-06-17 DIAGNOSIS — J45909 Unspecified asthma, uncomplicated: Secondary | ICD-10-CM | POA: Insufficient documentation

## 2023-06-17 DIAGNOSIS — R931 Abnormal findings on diagnostic imaging of heart and coronary circulation: Secondary | ICD-10-CM | POA: Diagnosis not present

## 2023-06-17 DIAGNOSIS — J4520 Mild intermittent asthma, uncomplicated: Secondary | ICD-10-CM | POA: Diagnosis not present

## 2023-06-17 MED ORDER — ALBUTEROL SULFATE HFA 108 (90 BASE) MCG/ACT IN AERS: 1.0000 | INHALATION_SPRAY | Freq: Four times a day (QID) | RESPIRATORY_TRACT | 5 refills | Status: AC | PRN

## 2023-06-17 NOTE — Assessment & Plan Note (Signed)
 Mild exertional desaturations-continue on oxygen at 2 L with activity to maintain O2 saturation greater than 88 to 90%.  Hold on PFTs at this time.  Has no significant cough or wheezing.  Is a never smoker.  Echo showed preserved EF.  Grade 1 diastolic dysfunction.  RV size normal and pulmonary artery systolic pressure was also normal.  No significant valvular disease-some moderate calcification on the aortic valve and mild regurg.  Overnight oximetry test with no significant nocturnal desaturations.  Would recommend using oxygen with 2 L to keep O2 saturations greater than 88 to 90%.

## 2023-06-17 NOTE — Assessment & Plan Note (Signed)
 Patient is a never smoker-but has significant secondhand smoke and occupational exposures.  She has no significant cough or wheezing.  Hold on PFTs at this time.  May use albuterol  as needed.  If develops significant symptoms can set up for pulmonary function testing.

## 2023-06-17 NOTE — Assessment & Plan Note (Signed)
 Non-small cell lung cancer right upper lobe nodule-status was SBRT completed March 2022.  Continue follow-up with radiation oncology.  Surveillance CT chest March 14, 2023 showed stable posttreatment scarring and no new areas of concern.  Patient has upcoming surveillance CT chest September 2025 with follow-up with radiation oncology.

## 2023-06-17 NOTE — Assessment & Plan Note (Signed)
 2D echo showed moderate calcification on the aortic valve with no stenosis.  Mild regurgitation of the aortic valve.  Patient has has minimal shortness of breath.  No evidence of volume overload. Recommend follow-up with primary care-most likely will need yearly echo or referral to cardiology for ongoing surveillance

## 2023-06-17 NOTE — Patient Instructions (Addendum)
 Continue on  Oxygen 2l/m with activity as needed to keep O2 sats >90%.  Albuterol inhaler As needed   Follow up with Radiation Oncology as planned in September with CT scan.  Follow up with Primary care -regarding Echo report with Aortic Valve calcification.  Follow up with Dr. Villa Greaser  in 1 year and As needed   Please contact office for sooner follow up if symptoms do not improve or worsen or seek emergency care

## 2023-06-17 NOTE — Progress Notes (Signed)
 @Patient  ID: Jillian Scott, female    DOB: 1935/10/11, 88 y.o.   MRN: [id number]  Chief Complaint  Patient presents with   Follow-up    Referring provider: No ref. 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   TEST/EVENTS :  PET 01/2020 >> Persistent increased metabolic activity in the spiculated pulmonary nodule SUV 2.2  , Rt hilum ,RIGHT maxillary sinus. 09/2019 CT chest WO con >> 13 x 9 x 17 mm spiculated nodular density in the RIGHT upper lobe, previously 13 x 8 x 16 mm. This measured 12 x 8 x 12 mm on 01/09/2018.    03/2019 CT chest Stable spiculated right upper lobe nodule 12.8 mm   12/2017 CT chest/abdomen/pelvis with contrast - 4 cm  benign pericardial cyst.  It also showed a 14 mm spiculated nodule in the right upper lobe   Chest CT without contrast on 09/13/22 demonstrates stable post treatment changes in the RUL without evidence of recurrent lung cancer or metastatic adenopathy.    FOB cytology 02/2020 + malignant cell -[facility]    06/17/2023 Follow up : Lung cancer  Patient presents for follow-up visit.  Last seen October 2024.  As above patient has a history of lung cancer diagnosed in February 2022.  Hypermetabolic right upper lobe lung nodule.  Cytology positive for non-small cell lung cancer.  Underwent XRT completed March 2022. She is followed by radiation oncology with serial CT chest.  Most recent CT March 14, 2023 showed biapical scarring, post treatment scarring in the right upper lobe with no appreciable change.  No new suspicious pulmonary nodules or masses.  Has upcoming surveillance CT chest September 2025.  Patient denies any hemoptysis.  Weight has been down some feels that she is eating well but just smaller portions.  She does live alone.  Does  not drive any longer.  Has mild memory impairment remains on Aricept.  Daughter visits her daily and helps with her care.  She uses a cane.  Last visit patient was having increased shortness of breath and ankle edema.  She had desaturations with ambulation.  She was started on oxygen 2 L with activity.  Set up for an overnight oximetry test 2D echo and lab work.  Lab work showed a minimally elevated D-dimer at 0.73.  She was recommended for a CT angio to rule out PE.  2D echo was done April 26, 2023.  Showed a preserved EF at 55 to 60%, grade 1 diastolic dysfunction.  Right ventricle systolic function was normal.  RV size was normal.  Pulmonary artery systolic pressure was normal.  Moderate calcification on aortic valve with no stenosis.  Overnight oximetry test done on April 20, 2023 showed minimum desaturations on room air while sleeping 6 minutes out of 12 hours of the total test time below 88%.  Baseline oxygen at 92%.  Patient has presumed mild intermittent asthma no previous PFTs today.  Occasionally uses some albuterol .  Patient was unable to follow-up for CT chest angio as previously recommended and a blood test were done later than recommended due to death in the family with her son who passed away from lung cancer.  Patient says since last visit she is feeling better.  Feels that her breathing is doing okay.  She has no significant cough or wheezing.  Uses  oxygen with activity at home as needed.  Does feel that it helps her.  Patient was not on oxygen today in the office O2 saturations walking from the lobby to the exam room were 99% on room air.    No Known Allergies  Immunization History  Administered Date(s) Administered   PFIZER(Purple Top)SARS-COV-2 Vaccination 04/05/2019, 04/26/2019   Zoster Recombinant(Shingrix) 08/14/2018    Past Medical History:  Diagnosis Date   COPD (chronic obstructive pulmonary disease) (HCC)    Dyspnea    History of radiation therapy 04/03/2020, 04/08/2020,  04/10/2020   Right Lung- SBRT Dr.  Retta Caster   Hyperlipidemia    Hypothyroidism    Maxillary sinus mass    NSCL ca     Tobacco History: Social History   Tobacco Use  Smoking Status Never  Smokeless Tobacco Never   Counseling given: Not Answered   Outpatient Medications Prior to Visit  Medication Sig Dispense Refill   acetaminophen  (TYLENOL ) 500 MG tablet Take 1,000 mg by mouth every 6 (six) hours as needed for mild pain.     aspirin EC 81 MG tablet Take 81 mg by mouth daily. Swallow whole.     Cholecalciferol (VITAMIN D3) 10 MCG (400 UNIT) CAPS Take 400 Units by mouth daily.     cyanocobalamin  1000 MCG tablet Take 1,000 mcg by mouth daily.     donepezil (ARICEPT) 5 MG tablet Take 5 mg by mouth at bedtime.     levothyroxine (SYNTHROID) 50 MCG tablet Take 50 mcg by mouth daily.     polyethylene glycol (MIRALAX  / GLYCOLAX ) 17 g packet Take 17 g by mouth daily. 14 each 0   rosuvastatin (CRESTOR) 10 MG tablet Take 10 mg by mouth daily.     albuterol (VENTOLIN HFA) 108 (90 Base) MCG/ACT inhaler Inhale 2 puffs into the lungs every 6 (six) hours as needed for wheezing or shortness of breath.     No facility-administered medications prior to visit.     Review of Systems:   Constitutional:   No  weight loss, night sweats,  Fevers, chills, fatigue, or  lassitude.  HEENT:   No headaches,  Difficulty swallowing,  Tooth/dental problems, or  Sore throat,                No sneezing, itching, ear ache, nasal congestion, post nasal drip,   CV:  No chest pain,  Orthopnea, PND, swelling in lower extremities, anasarca, dizziness, palpitations, syncope.   GI  No heartburn, indigestion, abdominal pain, nausea, vomiting, diarrhea, change in bowel habits, loss of appetite, bloody stools.   Resp: Skin: no rash or lesions.  GU: no dysuria, change in color of urine, no urgency or frequency.  No flank pain, no hematuria   MS:  No joint pain or swelling.  No decreased range of motion.  No back  pain.    Physical Exam  BP (!) 156/78 (BP Location: Right Arm, Cuff Size: Normal)   Pulse 73   Ht 5\' 3"  (1.6 m)   Wt 90 lb 6.4 oz (41 kg)   SpO2 99%   BMI 16.01 kg/m   GEN: A/Ox3; pleasant , NAD, thin, cane   HEENT:  [place]/AT,  , NOSE-clear, THROAT-clear, no lesions, no postnasal drip or exudate noted.   NECK:  Supple w/ fair ROM; no JVD; normal carotid impulses w/o bruits; no thyromegaly or nodules palpated; no lymphadenopathy.    RESP  Clear  P & A; w/o, wheezes/ rales/ or rhonchi. no accessory muscle use,  no dullness to percussion  CARD:  RRR, no m/r/g, no peripheral edema, pulses intact, no cyanosis or clubbing.  GI:   Soft & nt; nml bowel sounds; no organomegaly or masses detected.   Musco: Warm bil, no deformities or joint swelling noted.   Neuro: alert, no focal deficits noted.    Skin: Warm, no lesions or rashes    Lab Results:  CBC  Imaging: No results found.  Administration History     None           No data to display          No results found for: "NITRICOXIDE"      Assessment & Plan:   Non-small cell lung cancer (HCC) Non-small cell lung cancer right upper lobe nodule-status was SBRT completed March 2022.  Continue follow-up with radiation oncology.  Surveillance CT chest March 14, 2023 showed stable posttreatment scarring and no new areas of concern.  Patient has upcoming surveillance CT chest September 2025 with follow-up with radiation oncology.  Hypoxia Mild exertional desaturations-continue on oxygen at 2 L with activity to maintain O2 saturation greater than 88 to 90%.  Hold on PFTs at this time.  Has no significant cough or wheezing.  Is a never smoker.  Echo showed preserved EF.  Grade 1 diastolic dysfunction.  RV size normal and pulmonary artery systolic pressure was also normal.  No significant valvular disease-some moderate calcification on the aortic valve and mild regurg.  Overnight oximetry test with no significant  nocturnal desaturations.  Would recommend using oxygen with 2 L to keep O2 saturations greater than 88 to 90%.  Mild asthma Patient is a never smoker-but has significant secondhand smoke and occupational exposures.  She has no significant cough or wheezing.  Hold on PFTs at this time.  May use albuterol  as needed.  If develops significant symptoms can set up for pulmonary function testing.  Abnormal echocardiogram 2D echo showed moderate calcification on the aortic valve with no stenosis.  Mild regurgitation of the aortic valve.  Patient has has minimal shortness of breath.  No evidence of volume overload. Recommend follow-up with primary care-most likely will need yearly echo or referral to cardiology for ongoing surveillance     Roena Clark, NP 06/17/2023

## 2023-08-18 ENCOUNTER — Encounter: Payer: Self-pay | Admitting: Podiatry

## 2023-08-18 ENCOUNTER — Ambulatory Visit: Admitting: Podiatry

## 2023-08-18 VITALS — Ht 63.0 in | Wt 90.4 lb

## 2023-08-18 DIAGNOSIS — I8390 Asymptomatic varicose veins of unspecified lower extremity: Secondary | ICD-10-CM

## 2023-08-18 DIAGNOSIS — L84 Corns and callosities: Secondary | ICD-10-CM | POA: Diagnosis not present

## 2023-08-18 DIAGNOSIS — M2041 Other hammer toe(s) (acquired), right foot: Secondary | ICD-10-CM

## 2023-08-18 DIAGNOSIS — M79674 Pain in right toe(s): Secondary | ICD-10-CM

## 2023-08-18 DIAGNOSIS — B351 Tinea unguium: Secondary | ICD-10-CM

## 2023-08-18 DIAGNOSIS — M79675 Pain in left toe(s): Secondary | ICD-10-CM

## 2023-08-18 NOTE — Progress Notes (Signed)
  Subjective:  Patient ID: Jillian Scott, female    DOB: July 31, 1935,  MRN: [id number]  Chief Complaint  Patient presents with   Nail Problem    RM 21 Patient is here for nail trim, callus of the fifth right toe and circulation check of the feet. Patient sits often and feet become discolored.    89 y.o. female presents with the above complaint. History confirmed with patient.   Objective:  Physical Exam: warm, good capillary refill, no trophic changes or ulcerative lesions, normal DP and PT pulses, normal sensory exam, and varicosities noted. Left Foot: dystrophic yellowed discolored nail plates with subungual debris Right Foot: dystrophic yellowed discolored nail plates with subungual debris and callus right fifth toe PIPJ laterally with hammertoe contracture   Assessment:   1. Pain due to onychomycosis of toenails of both feet   2. Callus of foot   3. Hammertoe of right foot      Plan:  Patient was evaluated and treated and all questions answered.  Discussed the etiology and treatment options for the condition in detail with the patient. Recommended debridement of the nails today. Sharp and mechanical debridement performed of all painful and mycotic nails today. Nails debrided in length and thickness using a nail nipper to level of comfort. Follow up as needed for painful nails.   All symptomatic hyperkeratoses were safely debrided as a courtesy with a sterile #15 blade to patient's level of comfort without incident. We discussed preventative and palliative care of these lesions including supportive and accommodative shoegear, padding, prefabricated and custom molded accommodative orthoses, use of a pumice stone and lotions/creams daily.  Recommended offloading with silicone padding and utilize moisturizing cream    Signs are relatively asymptomatic we discussed if they worsen and begin swelling that compression therapy and walking for exercise would be a good option. Return in  about 3 months (around 11/18/2023) for painful thick fungal nails.

## 2023-09-08 ENCOUNTER — Telehealth: Payer: Self-pay | Admitting: *Deleted

## 2023-09-08 NOTE — Telephone Encounter (Signed)
 CALLED PATIENT TO INFORM OF CT FOR 09-23-23- ARRIVAL TIME- 2:15 PM @ [facility], NO RESTRICTIONS TO SCAN, PATIENT TO RECEIVE RESULTS FROM DR. KINARD ON 09-29-23 @ 11 AM, SPOKE WITH PATIENT'S RELATIVE, CATHY DONAHUE AND SHE IS AWARE OF THESE APPTS. AND THE INSTRUCTIONS

## 2023-09-23 ENCOUNTER — Ambulatory Visit (HOSPITAL_COMMUNITY)
Admission: RE | Admit: 2023-09-23 | Discharge: 2023-09-23 | Disposition: A | Discharge status: Home / Self Care | Source: Ambulatory Visit | Attending: Radiology | Admitting: Radiology

## 2023-09-23 DIAGNOSIS — C3411 Malignant neoplasm of upper lobe, right bronchus or lung: Secondary | ICD-10-CM | POA: Insufficient documentation

## 2023-09-28 NOTE — Progress Notes (Signed)
 Radiation Oncology         [phone] ________________________________  Name: Tykia Mellone MRN: [id number]  Date: 09/29/2023  DOB: 07-18-1935  Follow-Up Visit Note  CC: Venson Candis CROME, NP  Brenna Adine CROME, DO  No diagnosis found.  Diagnosis:  Stage I non-small cell carcinoma of the right upper lobe; s/p SBRT   Interval Since Last Radiation: 3 years, 6 months, 8 days    Intent: Curative Radiation Treatment Dates: 04/03/2020 through 04/10/2020 Site: Right lung Technique: SBRT Total Dose (Gy): 54/54 Dose per Fx (Gy): 18 Completed Fx: 3/3 Beam Energies: 6XFFF    Narrative:  The patient returns today for routine follow-up and to review most recent imaging. She was last seen in office on 03/24/23 for a routine follow up. Patient continued to follow up with their specialists to manage their chronic conditions.   Most recent Ct chest performed on 09/23/23 showed ***                         No other significant oncologic interval history since the patient was last seen.   Allergies:  has no known allergies.  Meds: Current Outpatient Medications  Medication Sig Dispense Refill   acetaminophen  (TYLENOL ) 500 MG tablet Take 1,000 mg by mouth every 6 (six) hours as needed for mild pain.     albuterol  (VENTOLIN  HFA) 108 (90 Base) MCG/ACT inhaler Inhale 1-2 puffs into the lungs every 6 (six) hours as needed for wheezing or shortness of breath. 1 each 5   aspirin EC 81 MG tablet Take 81 mg by mouth daily. Swallow whole.     Cholecalciferol (VITAMIN D3) 10 MCG (400 UNIT) CAPS Take 400 Units by mouth daily.     cyanocobalamin  1000 MCG tablet Take 1,000 mcg by mouth daily.     donepezil (ARICEPT) 5 MG tablet Take 5 mg by mouth at bedtime.     levothyroxine (SYNTHROID) 50 MCG tablet Take 50 mcg by mouth daily.     polyethylene glycol (MIRALAX  / GLYCOLAX ) 17 g packet Take 17 g by mouth daily. 14 each 0   rosuvastatin (CRESTOR) 10 MG tablet Take 10 mg by mouth daily.     No current  facility-administered medications for this encounter.    Physical Findings: The patient is in no acute distress. Patient is alert and oriented.  vitals were not taken for this visit. .  No significant changes. Lungs are clear to auscultation bilaterally. Heart has regular rate and rhythm. No palpable cervical, supraclavicular, or axillary adenopathy. Abdomen soft, non-tender, normal bowel sounds.   Lab Findings: Lab Results  Component Value Date   WBC 5.7 10/20/2022   HGB 11.9 (L) 10/20/2022   HCT 37.8 10/20/2022   MCV 91.8 10/20/2022   PLT 279.0 10/20/2022    Radiographic Findings: No results found.  Impression: Stage I non-small cell carcinoma of the right upper lobe; s/p SBRT  The patient is recovering from the effects of radiation.  ***  Plan:  ***   *** minutes of total time was spent for this patient encounter, including preparation, face-to-face counseling with the patient and coordination of care, physical exam, and documentation of the encounter. ____________________________________  Lynwood CHARM Nasuti, PhD, MD  This document serves as a record of services personally performed by Lynwood Nasuti, MD. It was created on his behalf by Reymundo Cartwright, a trained medical scribe. The creation of this record is based on the scribe's personal observations and the provider's statements  to them. This document has been checked and approved by the attending provider.

## 2023-09-29 ENCOUNTER — Ambulatory Visit
Admission: RE | Admit: 2023-09-29 | Discharge: 2023-09-29 | Disposition: A | Discharge status: Home / Self Care | Payer: Self-pay | Source: Ambulatory Visit | Attending: Radiation Oncology | Admitting: Radiation Oncology

## 2023-09-29 ENCOUNTER — Other Ambulatory Visit: Payer: Self-pay | Admitting: Radiology

## 2023-09-29 ENCOUNTER — Encounter: Payer: Self-pay | Admitting: Radiation Oncology

## 2023-09-29 VITALS — BP 137/71 | HR 92 | Temp 97.9°F | Resp 20 | Ht 64.0 in | Wt 94.6 lb

## 2023-09-29 DIAGNOSIS — N644 Mastodynia: Secondary | ICD-10-CM

## 2023-09-29 DIAGNOSIS — Z85118 Personal history of other malignant neoplasm of bronchus and lung: Secondary | ICD-10-CM | POA: Diagnosis present

## 2023-09-29 DIAGNOSIS — R079 Chest pain, unspecified: Secondary | ICD-10-CM | POA: Insufficient documentation

## 2023-09-29 DIAGNOSIS — Z79899 Other long term (current) drug therapy: Secondary | ICD-10-CM | POA: Diagnosis not present

## 2023-09-29 DIAGNOSIS — Z923 Personal history of irradiation: Secondary | ICD-10-CM | POA: Diagnosis not present

## 2023-09-29 DIAGNOSIS — Z79890 Hormone replacement therapy (postmenopausal): Secondary | ICD-10-CM | POA: Diagnosis not present

## 2023-09-29 DIAGNOSIS — C3491 Malignant neoplasm of unspecified part of right bronchus or lung: Secondary | ICD-10-CM

## 2023-09-29 DIAGNOSIS — Z7982 Long term (current) use of aspirin: Secondary | ICD-10-CM | POA: Diagnosis not present

## 2023-09-29 NOTE — Progress Notes (Signed)
 Tawana Pasch is here today for follow up post radiation to the lung.  Lung Side: Right Lung  Does the patient complain of any of the following: Pain:Denies Shortness of breath w/wo exertion: Yes Cough: Denies Hemoptysis: Denies Pain with swallowing: Denies Swallowing/choking concerns: Denies Appetite: Fair Energy Level: Fair Post radiation skin Changes: Denies    Additional comments if applicable: None at this time just waiting to hear results from CT scan. BP 137/71 (BP Location: Left Arm, Patient Position: Sitting)   Pulse 92   Temp 97.9 F (36.6 C) (Oral)   Resp 20   Ht 5' 4 (1.626 m)   Wt 94 lb 9.6 oz (42.9 kg)   SpO2 96%   BMI 16.24 kg/m

## 2023-10-06 ENCOUNTER — Ambulatory Visit
Admission: RE | Admit: 2023-10-06 | Discharge: 2023-10-06 | Disposition: A | Discharge status: Home / Self Care | Source: Ambulatory Visit | Attending: Radiology

## 2023-10-06 ENCOUNTER — Ambulatory Visit

## 2023-10-06 DIAGNOSIS — N644 Mastodynia: Secondary | ICD-10-CM

## 2023-10-12 ENCOUNTER — Telehealth: Payer: Self-pay | Admitting: Radiology

## 2023-10-12 NOTE — Telephone Encounter (Signed)
 I called Jillian Scott (preferred number) to review the results of patient's mammogram which demonstrates no evidence of malignancy. We will continue with regular f/up for her lung cancer.     Leeroy Due, PA-C

## 2023-11-17 ENCOUNTER — Encounter: Payer: Self-pay | Admitting: Podiatry

## 2023-11-17 ENCOUNTER — Ambulatory Visit: Admitting: Podiatry

## 2023-11-17 DIAGNOSIS — M79675 Pain in left toe(s): Secondary | ICD-10-CM

## 2023-11-17 DIAGNOSIS — B351 Tinea unguium: Secondary | ICD-10-CM | POA: Diagnosis not present

## 2023-11-17 DIAGNOSIS — M79674 Pain in right toe(s): Secondary | ICD-10-CM

## 2023-11-17 NOTE — Progress Notes (Signed)
 This patient presents to the office with chief complaint of long thick painful nails.  Patient says the nails are painful walking and wearing shoes.  This patient is unable to self treat.  This patient is unable to trim her nails since she is unable to reach her nails.  She has painful hammer toe right foot. She presents to the office for preventative foot care services. She presents to the office with her daughter.   General Appearance  Alert, conversant and in no acute stress.  Vascular  Dorsalis pedis and posterior tibial  pulses are  weakly palpable  bilaterally.  Capillary return is within normal limits  bilaterally. Temperature is within normal limits  bilaterally.  Neurologic  Senn-Weinstein monofilament wire test within normal limits  bilaterally. Muscle power within normal limits bilaterally.  Nails Thick disfigured discolored nails with subungual debris  from hallux to fifth toes bilaterally. No evidence of bacterial infection or drainage bilaterally.  Orthopedic  No limitations of motion  feet .  No crepitus or effusions noted.  No bony pathology or digital deformities noted. Hammer toe fifth toe right foot.  Skin  normotropic skin with no porokeratosis noted bilaterally.  No signs of infections or ulcers noted.     Onychomycosis  Nails  B/L.  Pain in right toes  Pain in left toes  Debridement of nails both feet followed trimming the nails with dremel tool.    RTC 3 months.   Cordella Bold DPM

## 2024-02-16 ENCOUNTER — Ambulatory Visit: Admitting: Podiatry

## 2024-02-16 DIAGNOSIS — M79674 Pain in right toe(s): Secondary | ICD-10-CM

## 2024-02-16 DIAGNOSIS — B351 Tinea unguium: Secondary | ICD-10-CM

## 2024-02-16 DIAGNOSIS — M79675 Pain in left toe(s): Secondary | ICD-10-CM

## 2024-02-16 NOTE — Progress Notes (Signed)
 This patient presents to the office with chief complaint of long thick painful nails.  Patient says the nails are painful walking and wearing shoes.  This patient is unable to self treat.  This patient is unable to trim her nails since she is unable to reach her nails.  She has painful hammer toe right foot. She presents to the office for preventative foot care services. She presents to the office with her daughter.   General Appearance  Alert, conversant and in no acute stress.  Vascular  Dorsalis pedis and posterior tibial  pulses are  weakly palpable  bilaterally.  Capillary return is within normal limits  bilaterally. Temperature is within normal limits  bilaterally.  Neurologic  Senn-Weinstein monofilament wire test within normal limits  bilaterally. Muscle power within normal limits bilaterally.  Nails Thick disfigured discolored nails with subungual debris  from hallux to fifth toes bilaterally. No evidence of bacterial infection or drainage bilaterally.  Orthopedic  No limitations of motion  feet .  No crepitus or effusions noted.  No bony pathology or digital deformities noted. Hammer toe fifth toe right foot.  Skin  normotropic skin with no porokeratosis noted bilaterally.  No signs of infections or ulcers noted.     Onychomycosis  Nails  B/L.  Pain in right toes  Pain in left toes  Debridement of nails both feet followed trimming the nails with dremel tool.  Padding fifth toe right foot.  RTC 3 months.   Cordella Bold DPM

## 2024-04-02 ENCOUNTER — Ambulatory Visit: Payer: Self-pay | Admitting: Radiation Oncology

## 2024-05-17 ENCOUNTER — Ambulatory Visit: Admitting: Podiatry
